# Patient Record
Sex: Female | Born: 1960 | Race: White | Hispanic: No | Marital: Married | Smoking: Current every day smoker
Health system: Southern US, Community
[De-identification: ages and names within clinical notes are randomized; demographics above are authoritative.]

## PROBLEM LIST (undated history)

## (undated) DIAGNOSIS — F329 Major depressive disorder, single episode, unspecified: Secondary | ICD-10-CM

## (undated) DIAGNOSIS — F32A Depression, unspecified: Secondary | ICD-10-CM

## (undated) DIAGNOSIS — E079 Disorder of thyroid, unspecified: Secondary | ICD-10-CM

## (undated) DIAGNOSIS — E119 Type 2 diabetes mellitus without complications: Secondary | ICD-10-CM

## (undated) DIAGNOSIS — E039 Hypothyroidism, unspecified: Secondary | ICD-10-CM

## (undated) HISTORY — DX: Major depressive disorder, single episode, unspecified: F32.9

## (undated) HISTORY — DX: Depression, unspecified: F32.A

## (undated) HISTORY — DX: Disorder of thyroid, unspecified: E07.9

## (undated) HISTORY — PX: CHOLECYSTECTOMY: SHX55

---

## 2006-11-07 ENCOUNTER — Ambulatory Visit (HOSPITAL_COMMUNITY): Admission: RE | Admit: 2006-11-07 | Discharge: 2006-11-07 | Payer: Self-pay | Admitting: Student

## 2010-09-17 ENCOUNTER — Ambulatory Visit (HOSPITAL_COMMUNITY): Admission: RE | Admit: 2010-09-17 | Discharge: 2010-09-17 | Payer: Self-pay | Admitting: Family Medicine

## 2010-09-23 ENCOUNTER — Ambulatory Visit (HOSPITAL_COMMUNITY): Admission: RE | Admit: 2010-09-23 | Discharge: 2010-09-23 | Payer: Self-pay | Admitting: Family Medicine

## 2011-03-29 ENCOUNTER — Other Ambulatory Visit: Payer: Self-pay | Admitting: Obstetrics and Gynecology

## 2011-03-29 DIAGNOSIS — N852 Hypertrophy of uterus: Secondary | ICD-10-CM

## 2011-04-01 ENCOUNTER — Ambulatory Visit (HOSPITAL_COMMUNITY)
Admission: RE | Admit: 2011-04-01 | Discharge: 2011-04-01 | Disposition: A | Discharge status: Home / Self Care | Payer: Self-pay | Source: Ambulatory Visit | Attending: Obstetrics and Gynecology | Admitting: Obstetrics and Gynecology

## 2011-04-01 ENCOUNTER — Ambulatory Visit (HOSPITAL_COMMUNITY): Payer: Self-pay

## 2011-04-01 DIAGNOSIS — R9389 Abnormal findings on diagnostic imaging of other specified body structures: Secondary | ICD-10-CM | POA: Insufficient documentation

## 2011-04-01 DIAGNOSIS — N852 Hypertrophy of uterus: Secondary | ICD-10-CM | POA: Insufficient documentation

## 2011-04-01 DIAGNOSIS — N898 Other specified noninflammatory disorders of vagina: Secondary | ICD-10-CM | POA: Insufficient documentation

## 2011-04-08 ENCOUNTER — Observation Stay (HOSPITAL_COMMUNITY)
Admission: EM | Admit: 2011-04-08 | Discharge: 2011-04-09 | Disposition: A | Discharge status: Home / Self Care | Payer: Self-pay | Source: Ambulatory Visit | Attending: Obstetrics & Gynecology | Admitting: Obstetrics & Gynecology

## 2011-04-08 ENCOUNTER — Emergency Department (HOSPITAL_COMMUNITY): Payer: Self-pay

## 2011-04-08 DIAGNOSIS — N920 Excessive and frequent menstruation with regular cycle: Secondary | ICD-10-CM | POA: Insufficient documentation

## 2011-04-08 DIAGNOSIS — D500 Iron deficiency anemia secondary to blood loss (chronic): Principal | ICD-10-CM | POA: Insufficient documentation

## 2011-04-08 LAB — BASIC METABOLIC PANEL
Calcium: 8.6 mg/dL (ref 8.4–10.5)
GFR calc Af Amer: >60 mL/min (ref >60)
GFR calc non Af Amer: >60 mL/min (ref >60)
Glucose, Bld: 113 mg/dL — ABNORMAL HIGH (ref 70–99)
Sodium: 136 mEq/L (ref 135–145)

## 2011-04-08 LAB — DIFFERENTIAL
Basophils Absolute: 0 10*3/uL (ref 0.0–0.1)
Basophils Relative: 0 % (ref 0–1)
Eosinophils Absolute: 0.2 10*3/uL (ref 0.0–0.7)
Eosinophils Relative: 2 % (ref 0–5)
Lymphocytes Relative: 19 % (ref 12–46)
Lymphs Abs: 1.8 10*3/uL (ref 0.7–4.0)
Monocytes Absolute: 0.8 10*3/uL (ref 0.1–1.0)
Monocytes Relative: 8 % (ref 3–12)
Neutro Abs: 6.9 10*3/uL (ref 1.7–7.7)
Neutrophils Relative %: 71 % (ref 43–77)

## 2011-04-08 LAB — CBC
HCT: 20.1 % — ABNORMAL LOW (ref 36.0–46.0)
MCHC: 31.8 g/dL (ref 30.0–36.0)
RDW: 15.9 % — ABNORMAL HIGH (ref 11.5–15.5)

## 2011-04-08 LAB — POCT PREGNANCY, URINE: Preg Test, Ur: NEGATIVE

## 2011-04-08 LAB — ABO/RH: ABO/RH(D): A POS

## 2011-04-09 LAB — CBC
HCT: 25.7 % — ABNORMAL LOW (ref 36.0–46.0)
Hemoglobin: 8.4 g/dL — ABNORMAL LOW (ref 12.0–15.0)
MCV: 93.5 fL (ref 78.0–100.0)
RBC: 2.75 MIL/uL — ABNORMAL LOW (ref 3.87–5.11)
WBC: 10.3 10*3/uL (ref 4.0–10.5)

## 2011-04-09 LAB — DIFFERENTIAL
Eosinophils Relative: 2 % (ref 0–5)
Lymphocytes Relative: 23 % (ref 12–46)
Lymphs Abs: 2.4 10*3/uL (ref 0.7–4.0)
Monocytes Relative: 6 % (ref 3–12)
Neutrophils Relative %: 69 % (ref 43–77)

## 2011-04-09 LAB — CROSSMATCH
Antibody Screen: NEGATIVE
Unit division: 0

## 2011-04-30 ENCOUNTER — Other Ambulatory Visit: Payer: Self-pay | Admitting: Obstetrics & Gynecology

## 2011-04-30 ENCOUNTER — Encounter (HOSPITAL_COMMUNITY): Payer: Self-pay | Attending: Obstetrics & Gynecology

## 2011-04-30 DIAGNOSIS — N840 Polyp of corpus uteri: Secondary | ICD-10-CM | POA: Insufficient documentation

## 2011-04-30 DIAGNOSIS — Z01812 Encounter for preprocedural laboratory examination: Secondary | ICD-10-CM | POA: Insufficient documentation

## 2011-04-30 DIAGNOSIS — N920 Excessive and frequent menstruation with regular cycle: Secondary | ICD-10-CM | POA: Insufficient documentation

## 2011-04-30 LAB — URINALYSIS, ROUTINE W REFLEX MICROSCOPIC
Glucose, UA: NEGATIVE mg/dL
Hgb urine dipstick: NEGATIVE
Ketones, ur: NEGATIVE mg/dL
pH: 6 (ref 5.0–8.0)

## 2011-04-30 LAB — COMPREHENSIVE METABOLIC PANEL
ALT: 17 U/L (ref 0–35)
AST: 15 U/L (ref 0–37)
Albumin: 4.2 g/dL (ref 3.5–5.2)
Alkaline Phosphatase: 67 U/L (ref 39–117)
BUN: 12 mg/dL (ref 6–23)
Chloride: 104 mEq/L (ref 96–112)
GFR calc Af Amer: >60 mL/min (ref >60)
Potassium: 4.3 mEq/L (ref 3.5–5.1)
Sodium: 138 mEq/L (ref 135–145)
Total Protein: 7.5 g/dL (ref 6.0–8.3)

## 2011-04-30 LAB — SURGICAL PCR SCREEN: Staphylococcus aureus: POSITIVE — AB

## 2011-04-30 LAB — CBC
HCT: 37.5 % (ref 36.0–46.0)
Hemoglobin: 12.1 g/dL (ref 12.0–15.0)
Platelets: 227 10*3/uL (ref 150–400)
RDW: 13.7 % (ref 11.5–15.5)

## 2011-05-05 ENCOUNTER — Other Ambulatory Visit: Payer: Self-pay | Admitting: Obstetrics & Gynecology

## 2011-05-05 ENCOUNTER — Ambulatory Visit (HOSPITAL_COMMUNITY)
Admission: RE | Admit: 2011-05-05 | Discharge: 2011-05-05 | Disposition: A | Discharge status: Home / Self Care | Payer: Self-pay | Source: Ambulatory Visit | Attending: Obstetrics & Gynecology | Admitting: Obstetrics & Gynecology

## 2011-05-05 DIAGNOSIS — N841 Polyp of cervix uteri: Secondary | ICD-10-CM | POA: Insufficient documentation

## 2011-05-05 DIAGNOSIS — N920 Excessive and frequent menstruation with regular cycle: Secondary | ICD-10-CM | POA: Insufficient documentation

## 2011-05-05 DIAGNOSIS — D509 Iron deficiency anemia, unspecified: Secondary | ICD-10-CM | POA: Insufficient documentation

## 2011-05-07 NOTE — Op Note (Signed)
NAME:  Andrea Benitez, Andrea Benitez              ACCOUNT NO.:  [id number]  MEDICAL RECORD NO.:  [id number]           PATIENT TYPE:  O  LOCATION:  DAYP                          FACILITY:  [facility]  PHYSICIAN:  Lazaro Arms, M.D.   DATE OF BIRTH:  [birth year]  DATE OF PROCEDURE:  05/05/2011 DATE OF DISCHARGE:                              OPERATIVE REPORT   PREOPERATIVE DIAGNOSES: 1. Menometrorrhagia. 2. Severe anemia requiring overnight hospitalization last month with     transfusion of 2 units packed red blood cells. 3. Positive response to megestrol therapy and iron. 4. Normal pelvic ultrasound of endometrium.  POSTOPERATIVE DIAGNOSES: 1. Menometrorrhagia. 2. Severe anemia requiring overnight hospitalization last month with     transfusion of 2 units packed red blood cells. 3. Positive response to megestrol therapy and iron. 4. Normal pelvic ultrasound of endometrium. 5. Endocervical polyp.  PROCEDURE: 1. Diagnostic hysteroscopy with uterine curettage. 2. Failed attempt at endometrial ablation. 3. Removal of endocervical polyp. 4. Cervical biopsies, random.  SURGEON:  Lazaro Arms, MD  ANESTHESIA:  General endotracheal.  FINDINGS:  The patient knew she had a slightly enlarged uterus, but from her ultrasound she had back on March 18, 2011, however, it was certainly appropriate for endometrial evaluation and ablation.  In any event, at the time of surgery today it was a large cavity with probably adenomyosis which caused the uterus to continue to expand.  I got to 120 mL and stopped at that point.  The endometrial polyp that was seen was not found that was seen on ultrasound.  Additionally, she had endocervical polyp which was removed and she has had sort of a firm cervix.  I did some random cervical biopsies to ensure no problem with that.  DESCRIPTION OF OPERATION:  The patient was taken to the operating room, placed in the supine position where she underwent general  endotracheal anesthesia.  She was placed in low lithotomy position, prepped and draped in usual sterile fashion.  Her cervix was dilated serially to allow passage of the hysteroscope.  An endocervical polyp was seen and removed.  An additional biopsy was taken to ensure complete removal. Diagnostic hysteroscopy was performed and was found to be normal.  There were no polyps, fibroids, or other abnormalities seen.  A vigorous uterine curettage was performed, and all tissue was removed and sent to pathology for evaluation.  I then attempted an endometrial ablation but was unsuccessful.  I got 120 mL and it was just too large and too spongy of a cavity to allow appropriate pressure maintenance in the balloon. As a result, the surgery was aborted.  I decided to go ahead and do three random biopsies as well of the cervix because she had just sort of firm, so I just took three random biopsies of the areas.  She had several nabothian cysts as well, all of which should be benign.  Monsel was used to obtain hemostasis.  The patient was then awakened from surgery, taken to recovery room in good and stable condition.  All counts correct.  She experienced minimal blood loss, and she received Ancef and Toradol preoperatively  prophylactically.     Lazaro Arms, M.D.     Loraine Maple  D:  05/05/2011  T:  05/05/2011  Job:  [id number]  Electronically Signed by Duane Lope M.D. on 05/07/2011 09:42:38 AM

## 2011-06-16 NOTE — Discharge Summary (Signed)
  [name], Andrea Benitez              ACCOUNT NO.:  [id number]  MEDICAL RECORD NO.:  [id number]  LOCATION:  A215                          FACILITY:  [facility]  PHYSICIAN:  Lazaro Arms, M.D.   DATE OF BIRTH:  August 21, 1961  DATE OF ADMISSION:  04/08/2011 DATE OF DISCHARGE:  04/27/2012LH                              DISCHARGE SUMMARY   DISCHARGE DIAGNOSES: 1. Status post 2 units of packed red blood cells. 2. Symptomatic anemia. 3. Menometrorrhagia.  Please refer to [facility] record for details of admission to the hospital.  HOSPITAL COURSE:  The patient was admitted with symptomatic anemia.  Her hemoglobin was 6.4, hematocrit was 20.1.  She was bleeding heavily. Ultrasound was normal except for a thickened endometrium.  I gave her high-dose Megace.  I gave her 2 units of packed red blood cells. Hemoglobin count 8.4 and 25.7.  She is scheduled to be seen in my office next week and to the plan endometrial ablation in the near future.  She was discharged to home on Megace iron.     Lazaro Arms, M.D.     Andrea Benitez  D:  06/15/2011  T:  06/16/2011  Job:  [id number]  Electronically Signed by Duane Lope M.D. on 06/16/2011 04:45:08 PM

## 2011-10-28 ENCOUNTER — Other Ambulatory Visit (HOSPITAL_COMMUNITY): Payer: Self-pay | Admitting: Family Medicine

## 2011-10-28 DIAGNOSIS — Z139 Encounter for screening, unspecified: Secondary | ICD-10-CM

## 2011-11-09 ENCOUNTER — Ambulatory Visit (HOSPITAL_COMMUNITY)
Admission: RE | Admit: 2011-11-09 | Discharge: 2011-11-09 | Disposition: A | Discharge status: Home / Self Care | Payer: PRIVATE HEALTH INSURANCE | Source: Ambulatory Visit | Attending: Family Medicine | Admitting: Family Medicine

## 2011-11-09 DIAGNOSIS — Z139 Encounter for screening, unspecified: Secondary | ICD-10-CM

## 2011-11-09 DIAGNOSIS — Z1231 Encounter for screening mammogram for malignant neoplasm of breast: Secondary | ICD-10-CM | POA: Insufficient documentation

## 2011-12-29 IMAGING — MG MM DIGITAL SCREENING
1 series · 1 of 1 positions shown · non-contrast
Comparison: Prior studies.

DG SCREEN MAMMOGRAM BILATERAL
Bilateral CC and MLO view(s) were taken.

DIGITAL SCREENING MAMMOGRAM WITH CAD:

[R MLO]
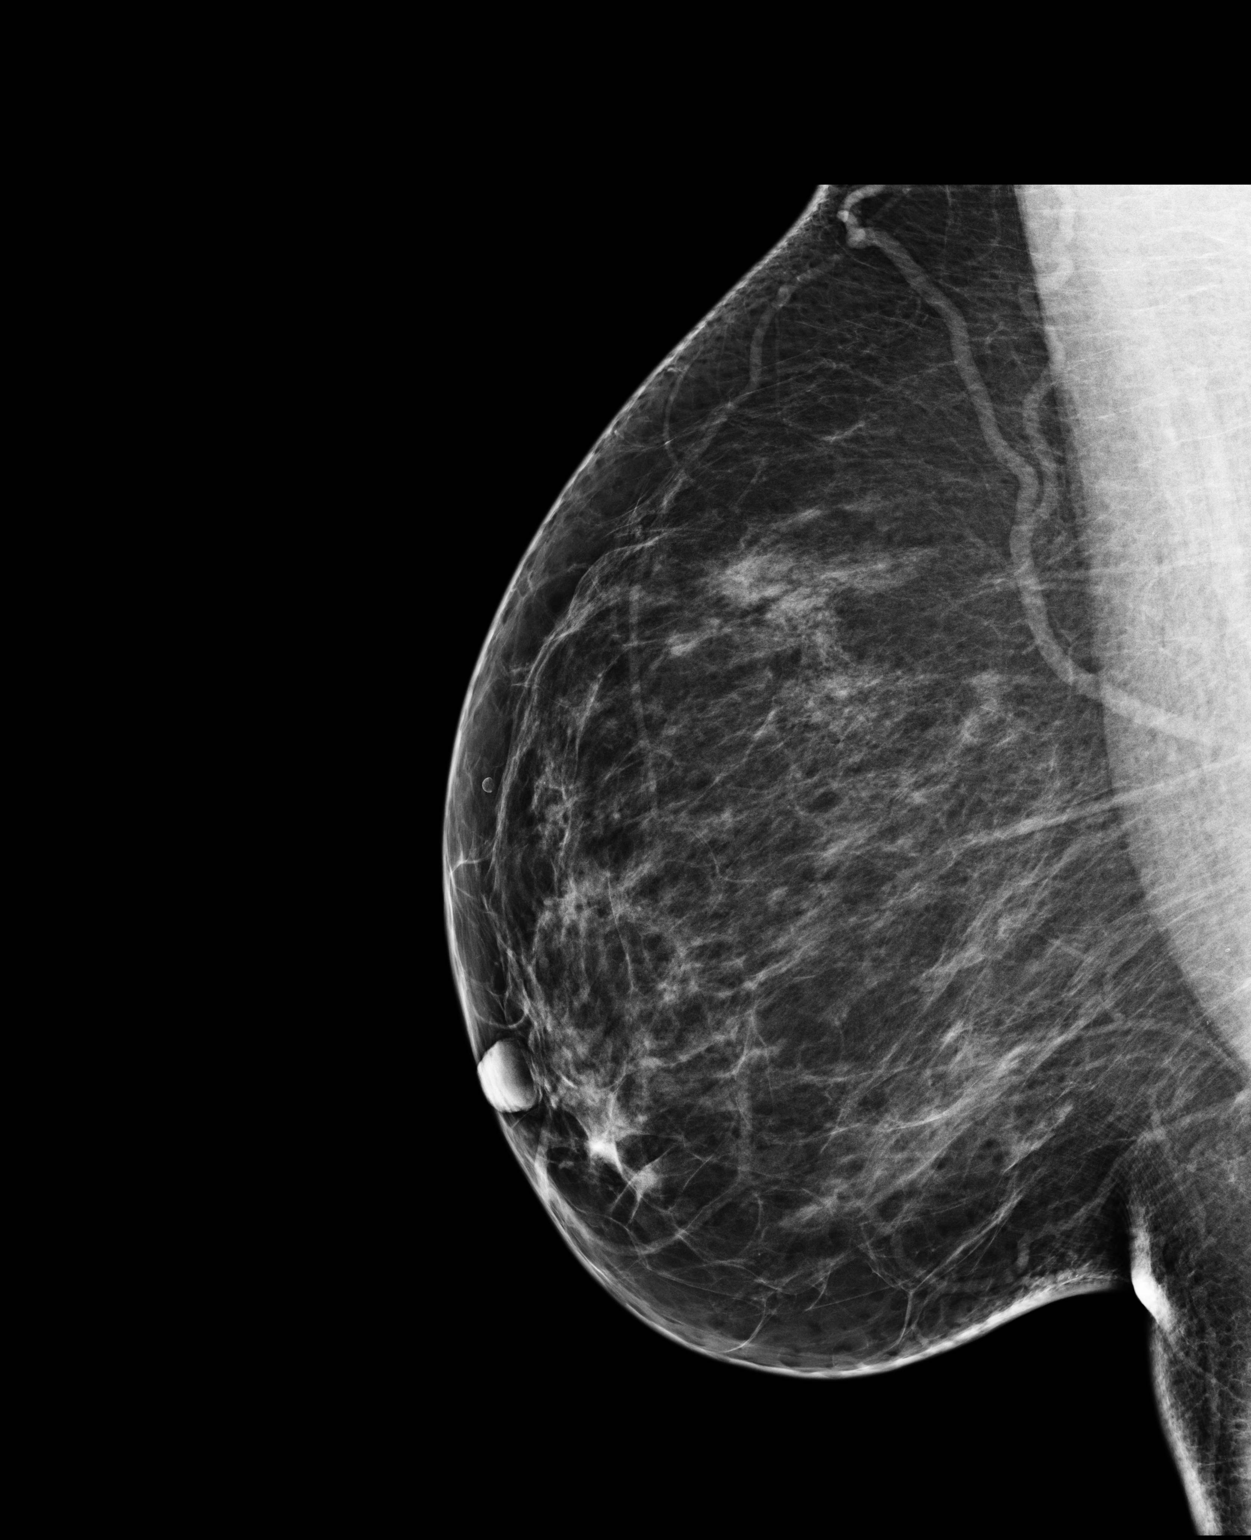

[1 of 1 positions shown; findings below may reference images not displayed]

There are scattered fibroglandular densities.  A possible mass is noted in the right breast.  Spot 
compression views and possibly sonography are recommended for further evaluation.  The left breast 
is unremarkable.

Images were processed with CAD.
IMPRESSION: Possible mass, right breast.  Additional evaluation is indicated.  The patient will be contacted 
for additional studies and a supplemental report will follow.

ASSESSMENT: Need additional imaging evaluation and/or prior mammograms for comparison - BI-RADS 0 -
Right

Further imaging of the right breast.
,

## 2012-04-03 ENCOUNTER — Other Ambulatory Visit: Payer: Self-pay | Admitting: Obstetrics & Gynecology

## 2012-07-12 IMAGING — US US TRANSVAGINAL NON-OB
1 series · 13 of 25 positions shown · non-contrast
Comparison: None.

CLINICAL DATA: Enlarged uterus with heavy vaginal bleeding.  LMP
03/18/2011



[Series 1: us transvaginal non-ob · 0.31mm/px · 13 of 100 slices shown]
[im 1/100]
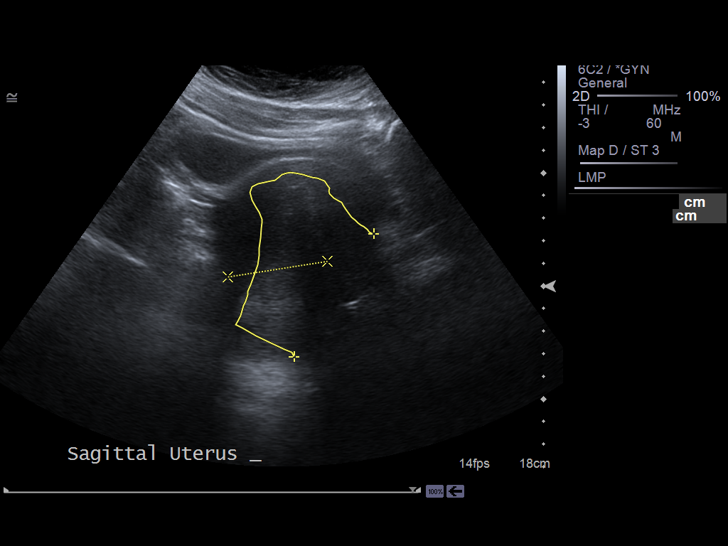
[im 9/100]
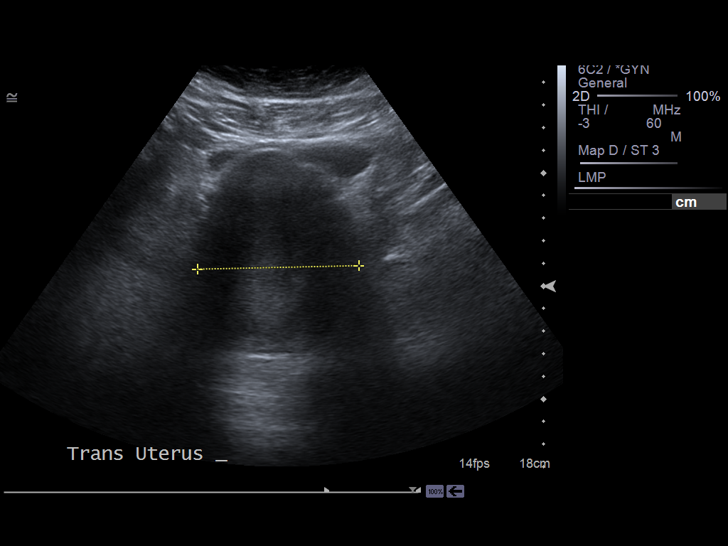
[im 17/100]
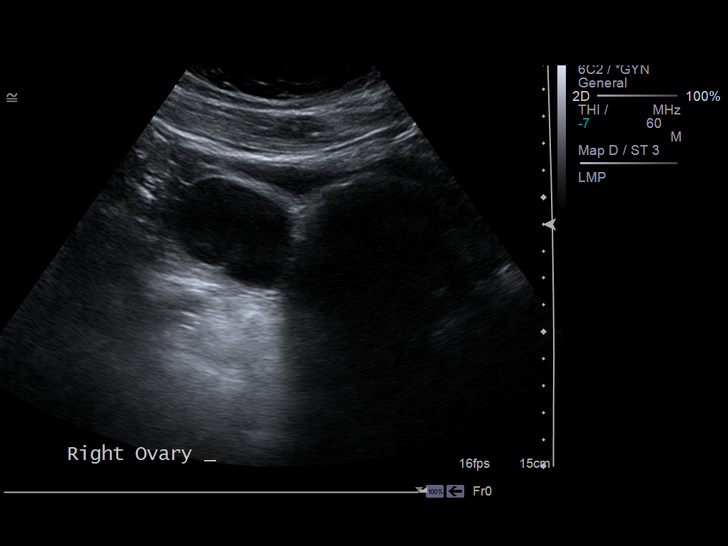
[im 25/100]
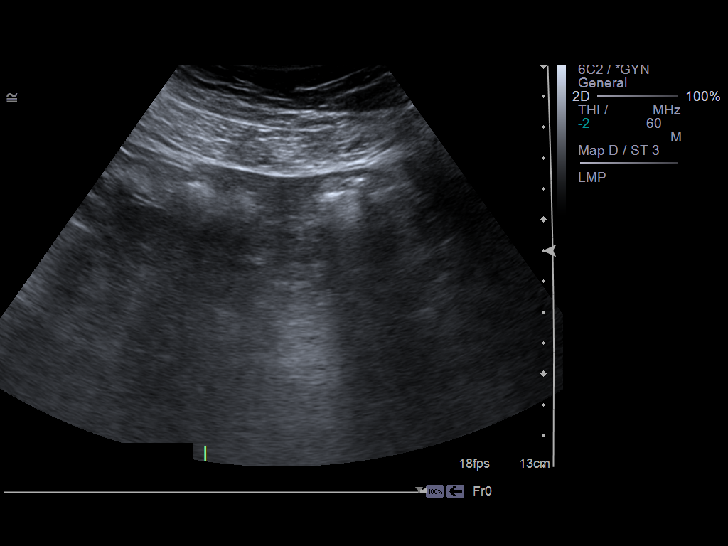
[im 34/100]
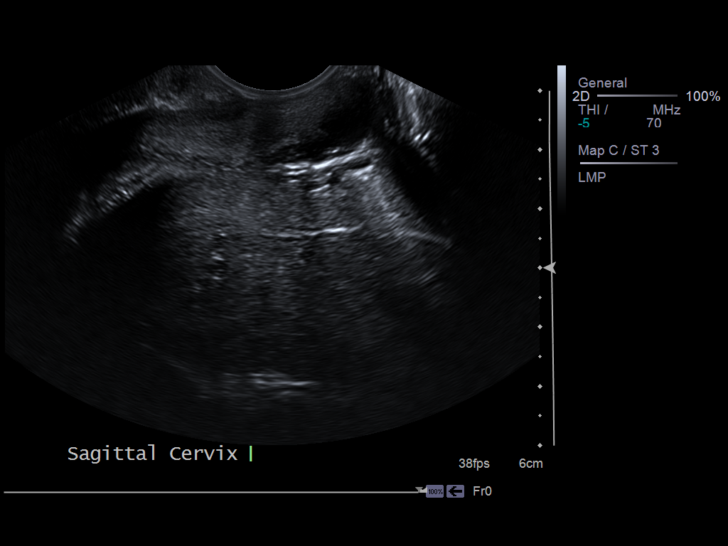
[im 42/100]
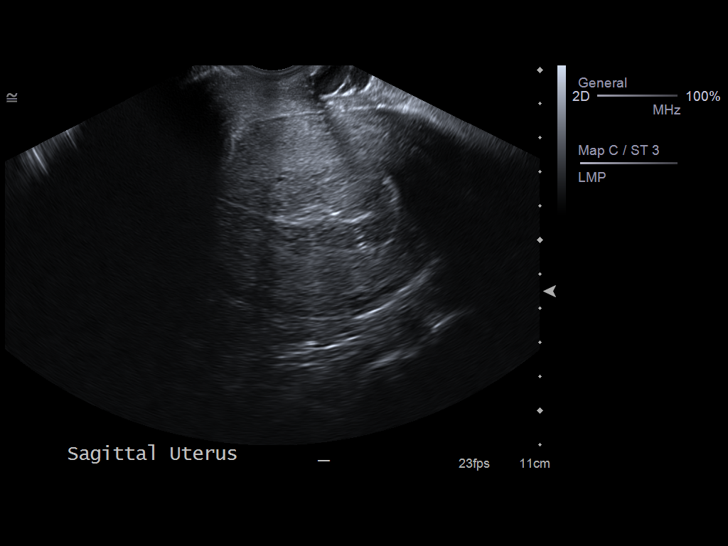
[im 50/100]
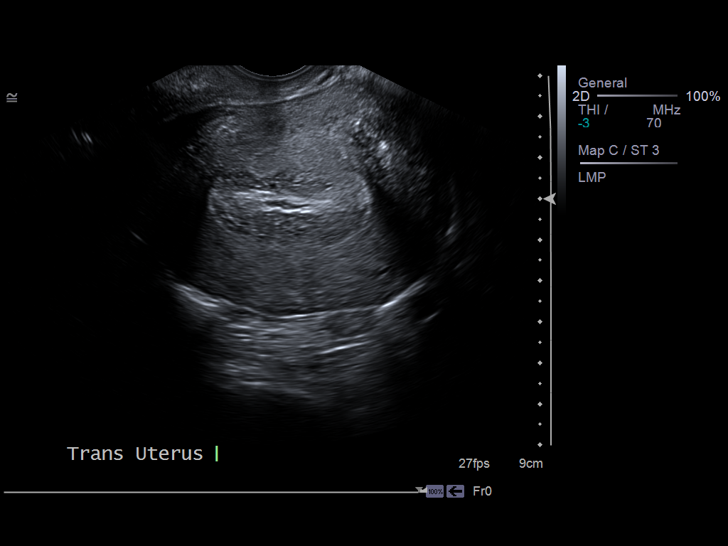
[im 58/100]
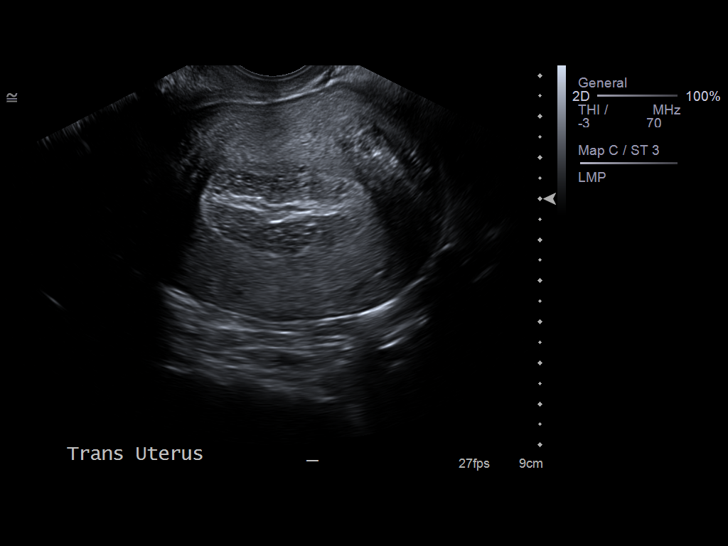
[im 67/100]
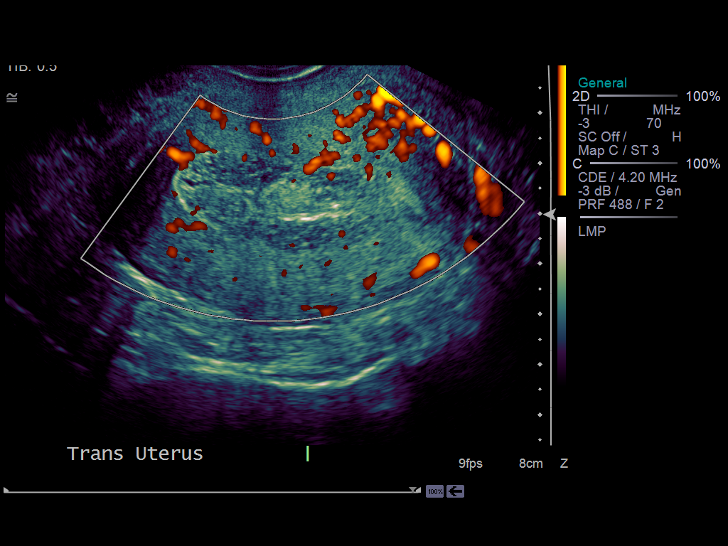
[im 75/100]
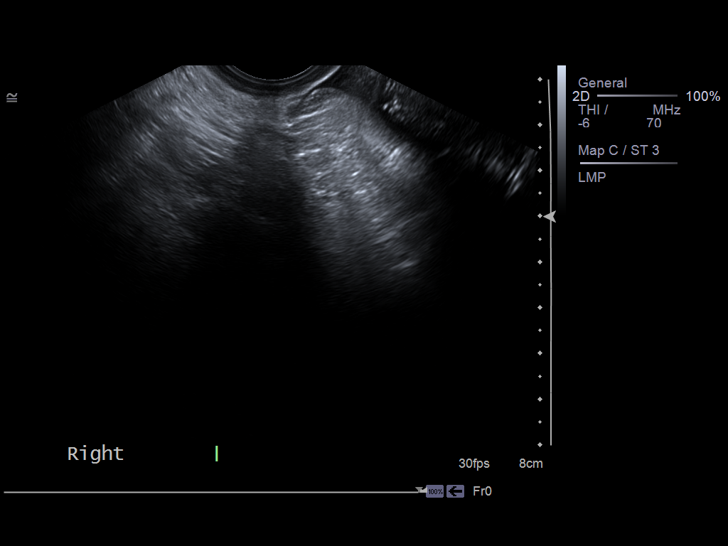
[im 83/100]
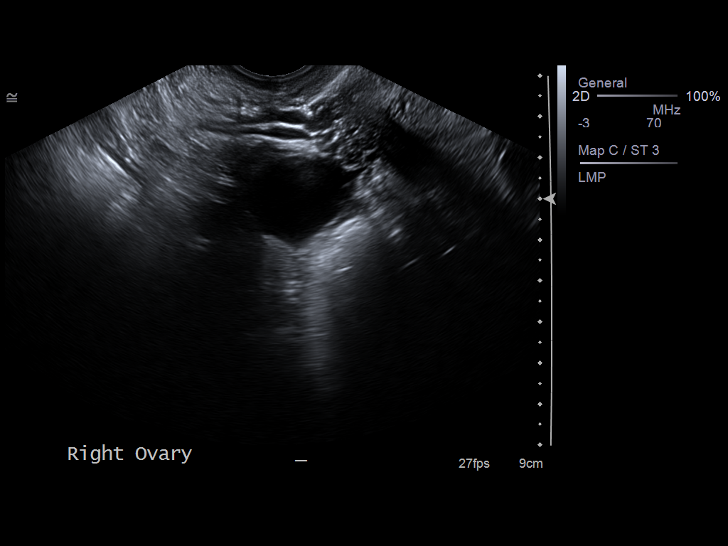
[im 91/100]
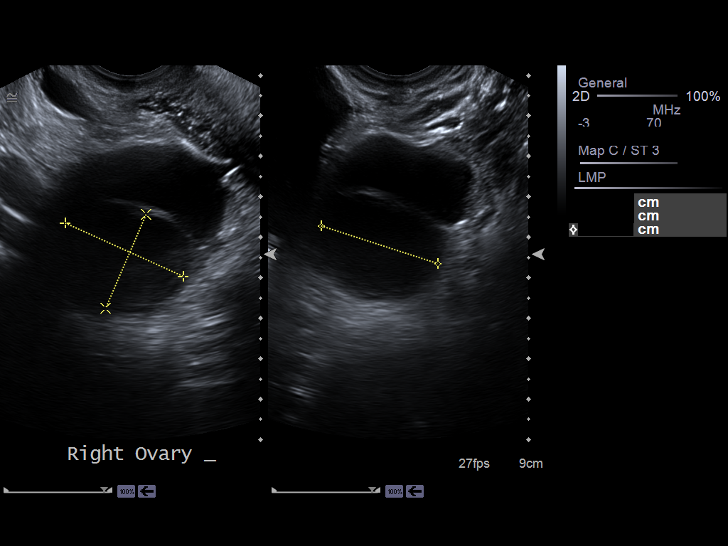
[im 100/100]
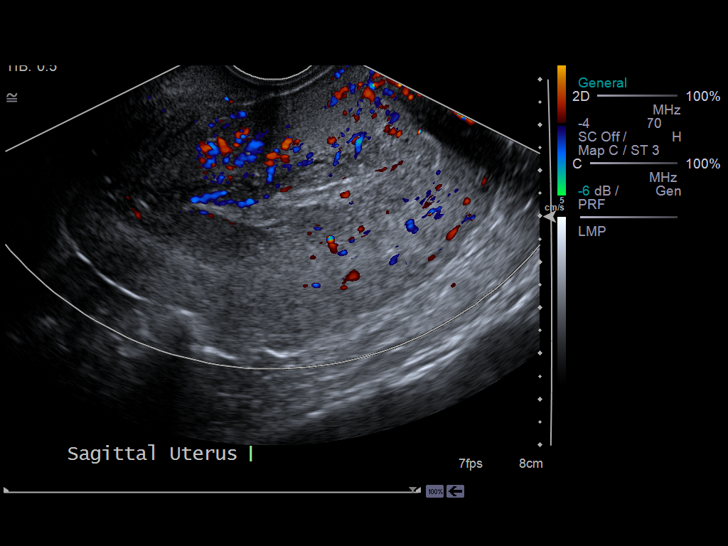

[13 of 25 positions shown; findings below may reference images not displayed]

FINDINGS: Uterus:  Is retroflexed and demonstrates a sagittal length of
cm, an AP depth of 6.3 cm and a transverse width of 7.7 cm.

Endometrium:  The endometrial complex has an unusual morphology
with an echogenic central area and surrounding hypoechoic area
containing multiple small cystic foci.  This has an overall width
of 2.6 cm.  I suspect that this represents pseudo endometrial
widening and may be the result of diffuse adenomyosis with a cystic
hypoechoic transitional zone. An area of focal increased
echogenicity is seen in the posterior fundal region measuring
by 1.6 by 0.9 cm.  This demonstrates a feeding vessel and is most
compatible with a focal polyp.

Right ovary:  A normal appearing right ovary is not visualized with
confidence.  In the right adnexa there is a thin walled cystic area
measuring 5.2 x 3.4 x 3.6 cm which contains a solitary septation.
On the transabdominal scan this septation appears incomplete and
raises the possibility of this representing a hydrosalpinx.  As no
separate right ovary can be seen with confidence, the possibility
of a cystic ovarian lesion is not excluded.

Left ovary:  Measures 2.4 x 1.5 by 2.2 cm and contains a dominant
follicle.

Other findings:  No pelvic fluid is noted
IMPRESSION: Prominent uterine size with suspected pseudowidening of the
endometrial complex with an appearance raising the possibility of
diffuse adenomyosis.

Findings compatible with a focal endometrial polyp.

Right adnexal cystic lesion with suggested incomplete septa
suspicious for a hydrosalpinx.  A separate right ovary is not seen
and therefore ovarian lesion is not excluded with confidence.
Further evaluation of this finding and the abnormal endometrial
complex can be performed with pelvic MRI with contrast which can
provide improved evaluation of the transitional zone thickness and
may allow identification of a separate right ovary and confirm the
suspicion of tubal dilatation.

## 2012-12-21 ENCOUNTER — Other Ambulatory Visit (HOSPITAL_COMMUNITY): Payer: Self-pay | Admitting: *Deleted

## 2012-12-21 DIAGNOSIS — Z139 Encounter for screening, unspecified: Secondary | ICD-10-CM

## 2012-12-28 ENCOUNTER — Ambulatory Visit (HOSPITAL_COMMUNITY)
Admission: RE | Admit: 2012-12-28 | Discharge: 2012-12-28 | Disposition: A | Discharge status: Home / Self Care | Payer: Self-pay | Source: Ambulatory Visit | Attending: *Deleted | Admitting: *Deleted

## 2012-12-28 DIAGNOSIS — Z139 Encounter for screening, unspecified: Secondary | ICD-10-CM

## 2013-07-16 ENCOUNTER — Telehealth: Payer: Self-pay | Admitting: Obstetrics & Gynecology

## 2013-07-16 MED ORDER — MEGESTROL ACETATE 40 MG PO TABS: 40.0000 mg | ORAL_TABLET | Freq: Every day | ORAL | Status: DC

## 2013-07-16 NOTE — Telephone Encounter (Signed)
Requesting refill on Megace for vaginal bleeding. Pt states does have an appt 07/27/2013 with Dr. Despina Hidden.

## 2013-07-27 ENCOUNTER — Ambulatory Visit: Payer: Self-pay | Admitting: Obstetrics & Gynecology

## 2013-08-20 ENCOUNTER — Ambulatory Visit: Payer: Self-pay | Admitting: Obstetrics & Gynecology

## 2013-10-30 ENCOUNTER — Encounter (INDEPENDENT_AMBULATORY_CARE_PROVIDER_SITE_OTHER): Payer: Self-pay

## 2013-10-30 ENCOUNTER — Ambulatory Visit (INDEPENDENT_AMBULATORY_CARE_PROVIDER_SITE_OTHER): Payer: Self-pay | Admitting: Obstetrics & Gynecology

## 2013-10-30 ENCOUNTER — Encounter: Payer: Self-pay | Admitting: Obstetrics & Gynecology

## 2013-10-30 VITALS — BP 130/90 | Ht 64.0 in | Wt 212.0 lb

## 2013-10-30 DIAGNOSIS — F3289 Other specified depressive episodes: Secondary | ICD-10-CM

## 2013-10-30 DIAGNOSIS — F329 Major depressive disorder, single episode, unspecified: Secondary | ICD-10-CM

## 2013-10-30 MED ORDER — ESCITALOPRAM OXALATE 10 MG PO TABS: 10.0000 mg | ORAL_TABLET | Freq: Every day | ORAL | Status: DC

## 2013-10-30 NOTE — Progress Notes (Signed)
Patient ID: Andrea Benitez, female   DOB: 05/20/1961, 52 y.o.   MRN: [id number] Pt presents complaining of problems once again with depression stemming from a 30 year history of marital issues related, in her mind, to infidelity Not confirmed she says Had marital counselling in past  None currently Anhedonia, diminished energy  Will try Lexapro 10 mg daily Follow up 1 month for evaluation

## 2013-11-27 ENCOUNTER — Encounter: Payer: Self-pay | Admitting: Obstetrics & Gynecology

## 2013-11-27 ENCOUNTER — Ambulatory Visit (INDEPENDENT_AMBULATORY_CARE_PROVIDER_SITE_OTHER): Payer: Self-pay | Admitting: Obstetrics & Gynecology

## 2013-11-27 VITALS — BP 138/90 | Wt 210.0 lb

## 2013-11-27 DIAGNOSIS — F329 Major depressive disorder, single episode, unspecified: Secondary | ICD-10-CM

## 2013-11-27 DIAGNOSIS — F3289 Other specified depressive episodes: Secondary | ICD-10-CM

## 2013-11-27 NOTE — Progress Notes (Signed)
Patient ID: Andrea Benitez, female   DOB: [birth year], 52 y.o.   MRN: [id number] Pt started on Lexapro 10mg  1 month ago Pt states she is "completely" better  Will maintain on that dose No side effects or complications  Follow up in 3 months  Past Medical History  Diagnosis Date  . Thyroid disease   . Depression     Past Surgical History  Procedure Laterality Date  . Cholecystectomy      OB History   Grav Para Term Preterm Abortions TAB SAB Ect Mult Living                  Not on File  History   Social History  . Marital Status: Married    Spouse Name: N/A    Number of Children: N/A  . Years of Education: N/A   Social History Main Topics  . Smoking status: Current Every Day Smoker  . Smokeless tobacco: None  . Alcohol Use: None  . Drug Use: None  . Sexual Activity: None   Other Topics Concern  . None   Social History Narrative  . None    History reviewed. No pertinent family history.  Patient Active Problem List   Diagnosis Date Noted  . Depressive disorder, not elsewhere classified 10/30/2013

## 2014-01-02 ENCOUNTER — Telehealth: Payer: Self-pay | Admitting: *Deleted

## 2014-01-02 MED ORDER — ESCITALOPRAM OXALATE 10 MG PO TABS: 10.0000 mg | ORAL_TABLET | Freq: Every day | ORAL | Status: DC

## 2014-01-02 NOTE — Telephone Encounter (Signed)
Spoke with pt. She is requesting a refill on Lexapro 10 mg. She saw you in December. Will you refill this? Uses [facility] in [place]. Thanks!!!

## 2014-01-02 NOTE — Telephone Encounter (Signed)
Left message letting pt know Lexapro was refilled. Sent to pharmacy. [place]

## 2014-02-25 ENCOUNTER — Ambulatory Visit: Payer: Self-pay | Admitting: Obstetrics & Gynecology

## 2014-02-26 ENCOUNTER — Ambulatory Visit (INDEPENDENT_AMBULATORY_CARE_PROVIDER_SITE_OTHER): Payer: Self-pay | Admitting: Obstetrics & Gynecology

## 2014-02-26 ENCOUNTER — Encounter (INDEPENDENT_AMBULATORY_CARE_PROVIDER_SITE_OTHER): Payer: Self-pay

## 2014-02-26 ENCOUNTER — Encounter: Payer: Self-pay | Admitting: Obstetrics & Gynecology

## 2014-02-26 VITALS — BP 120/80 | Wt 210.0 lb

## 2014-02-26 DIAGNOSIS — F329 Major depressive disorder, single episode, unspecified: Secondary | ICD-10-CM

## 2014-02-26 DIAGNOSIS — F3289 Other specified depressive episodes: Secondary | ICD-10-CM

## 2014-02-26 NOTE — Progress Notes (Signed)
Patient ID: Andrea Benitez, female   DOB: 10/17/1961, 53 y.o.   MRN: [id number] Follow up 4 months on Lexapro 10 mg Pt states she is "completely" better  Will continue for at least 3 more months Follow up at that time  Past Medical History  Diagnosis Date  . Thyroid disease   . Depression     Past Surgical History  Procedure Laterality Date  . Cholecystectomy      OB History   Grav Para Term Preterm Abortions TAB SAB Ect Mult Living                  Not on File  History   Social History  . Marital Status: Married    Spouse Name: N/A    Number of Children: N/A  . Years of Education: N/A   Social History Benitez Topics  . Smoking status: Current Every Day Smoker  . Smokeless tobacco: None  . Alcohol Use: None  . Drug Use: None  . Sexual Activity: None   Other Topics Concern  . None   Social History Narrative  . None    History reviewed. No pertinent family history.

## 2014-04-02 ENCOUNTER — Other Ambulatory Visit (HOSPITAL_COMMUNITY): Payer: Self-pay | Admitting: *Deleted

## 2014-04-02 DIAGNOSIS — Z1231 Encounter for screening mammogram for malignant neoplasm of breast: Secondary | ICD-10-CM

## 2014-04-05 ENCOUNTER — Ambulatory Visit (HOSPITAL_COMMUNITY): Payer: Self-pay

## 2014-04-08 ENCOUNTER — Ambulatory Visit (HOSPITAL_COMMUNITY)
Admission: RE | Admit: 2014-04-08 | Discharge: 2014-04-08 | Disposition: A | Discharge status: Home / Self Care | Payer: PRIVATE HEALTH INSURANCE | Source: Ambulatory Visit | Attending: *Deleted | Admitting: *Deleted

## 2014-04-08 DIAGNOSIS — Z1231 Encounter for screening mammogram for malignant neoplasm of breast: Secondary | ICD-10-CM

## 2014-04-10 ENCOUNTER — Other Ambulatory Visit (HOSPITAL_COMMUNITY): Payer: Self-pay | Admitting: *Deleted

## 2014-04-10 DIAGNOSIS — R928 Other abnormal and inconclusive findings on diagnostic imaging of breast: Secondary | ICD-10-CM

## 2014-05-01 ENCOUNTER — Ambulatory Visit (HOSPITAL_COMMUNITY)
Admission: RE | Admit: 2014-05-01 | Discharge: 2014-05-01 | Disposition: A | Discharge status: Home / Self Care | Payer: PRIVATE HEALTH INSURANCE | Source: Ambulatory Visit | Attending: *Deleted | Admitting: *Deleted

## 2014-05-01 ENCOUNTER — Other Ambulatory Visit (HOSPITAL_COMMUNITY): Payer: Self-pay | Admitting: *Deleted

## 2014-05-01 DIAGNOSIS — R928 Other abnormal and inconclusive findings on diagnostic imaging of breast: Secondary | ICD-10-CM

## 2014-05-01 DIAGNOSIS — N6089 Other benign mammary dysplasias of unspecified breast: Secondary | ICD-10-CM | POA: Insufficient documentation

## 2014-05-08 ENCOUNTER — Other Ambulatory Visit: Payer: Self-pay | Admitting: Obstetrics & Gynecology

## 2014-06-03 ENCOUNTER — Ambulatory Visit (INDEPENDENT_AMBULATORY_CARE_PROVIDER_SITE_OTHER): Payer: Self-pay | Admitting: Obstetrics & Gynecology

## 2014-06-03 ENCOUNTER — Encounter: Payer: Self-pay | Admitting: Obstetrics & Gynecology

## 2014-06-03 VITALS — BP 120/80 | Ht 64.0 in | Wt 211.0 lb

## 2014-06-03 DIAGNOSIS — F32A Depression, unspecified: Secondary | ICD-10-CM

## 2014-06-03 DIAGNOSIS — F3289 Other specified depressive episodes: Secondary | ICD-10-CM

## 2014-06-03 DIAGNOSIS — F329 Major depressive disorder, single episode, unspecified: Secondary | ICD-10-CM

## 2014-06-03 MED ORDER — ESCITALOPRAM OXALATE 10 MG PO TABS: ORAL_TABLET | ORAL | Status: DC

## 2014-06-03 NOTE — Progress Notes (Signed)
Patient ID: Andrea Benitez, female   DOB: 26-May-1961, 53 y.o.   MRN: [id number]  Blood pressure 120/80, height 5\' 4"  (1.626 m), weight 211 lb (95.709 kg).    Pt is here to evaluate her on going management with lexapro 10 mg We re evaluated previously and she wanted to stay on the 10 mg dosage She has continued to do well She is satisfied with her mood and anxiety levels She has more energy Her libido and sexual response is normal  ROS No burning with urination, frequency or urgency No nausea, vomiting or diarrhea Nor fever chills or other constitutional symptoms  No exam  Major depressive episode: Continue lexapro 10 mg We discussed stopping and pt wants to continue and I am supportive of that

## 2014-07-22 ENCOUNTER — Other Ambulatory Visit: Payer: Self-pay | Admitting: Obstetrics & Gynecology

## 2015-01-13 ENCOUNTER — Encounter: Payer: Self-pay | Admitting: Obstetrics & Gynecology

## 2015-01-13 ENCOUNTER — Ambulatory Visit (INDEPENDENT_AMBULATORY_CARE_PROVIDER_SITE_OTHER): Payer: Self-pay | Admitting: Obstetrics & Gynecology

## 2015-01-13 VITALS — BP 140/80 | Wt 222.0 lb

## 2015-01-13 DIAGNOSIS — F329 Major depressive disorder, single episode, unspecified: Secondary | ICD-10-CM

## 2015-01-13 DIAGNOSIS — N939 Abnormal uterine and vaginal bleeding, unspecified: Secondary | ICD-10-CM

## 2015-01-13 DIAGNOSIS — F32A Depression, unspecified: Secondary | ICD-10-CM

## 2015-01-13 MED ORDER — ESCITALOPRAM OXALATE 20 MG PO TABS: ORAL_TABLET | ORAL | Status: DC

## 2015-01-13 NOTE — Progress Notes (Signed)
Patient ID: Andrea Benitez, female   DOB: Feb 26, 1961, 54 y.o.   MRN: [id number] Pt has been on lexapro for maybe a year On 10 mg daily Feels a bit more sluggish and tired  Seasonal depression has been an issue in past   Will bump up to 20 mg daily  Continue megace  Yearly exams throught [facility]

## 2015-07-22 ENCOUNTER — Ambulatory Visit: Payer: Self-pay | Admitting: Obstetrics & Gynecology

## 2015-08-26 ENCOUNTER — Ambulatory Visit: Payer: Self-pay | Admitting: Obstetrics & Gynecology

## 2015-09-02 ENCOUNTER — Ambulatory Visit (INDEPENDENT_AMBULATORY_CARE_PROVIDER_SITE_OTHER): Payer: Self-pay | Admitting: Obstetrics & Gynecology

## 2015-09-02 ENCOUNTER — Encounter: Payer: Self-pay | Admitting: Obstetrics & Gynecology

## 2015-09-02 VITALS — BP 120/80 | HR 72 | Wt 224.0 lb

## 2015-09-02 DIAGNOSIS — F32A Depression, unspecified: Secondary | ICD-10-CM

## 2015-09-02 DIAGNOSIS — F329 Major depressive disorder, single episode, unspecified: Secondary | ICD-10-CM

## 2015-09-02 MED ORDER — ESCITALOPRAM OXALATE 20 MG PO TABS
ORAL_TABLET | ORAL | Status: DC
Start: 2015-09-02 — End: 2016-03-11

## 2015-09-02 NOTE — Progress Notes (Signed)
Patient ID: Andrea Benitez, female   DOB: Apr 14, 1961, 54 y.o.   MRN: [id number] Chief Complaint  Patient presents with  . Follow-up    Lexapro.    Blood pressure 120/80, pulse 72, weight 224 lb (101.606 kg).  54 y.o. No obstetric history on file. No LMP recorded. Patient is postmenopausal. The current method of family planning is post menopausal status.  Subjective No bleeding off megestrol Pt on lexapro doing well No suicidal thoughts or ideations No crying spells No insomnia Sex drive ok, can have orgams  Objective   Pertinent ROS   Labs or studies     Impression Diagnoses this Encounter::   ICD-9-CM ICD-10-CM   1. Depression 311 F32.9     Established relevant diagnosis(es):   Plan/Recommendations: Meds ordered this encounter  Medications  . escitalopram (LEXAPRO) 20 MG tablet    Sig: TAKE ONE TABLET BY MOUTH DAILY    Dispense:  30 tablet    Refill:  6    Labs or Scans Ordered: No orders of the defined types were placed in this encounter.      Follow up Return in about 6 months (around 03/01/2016) for Follow up, with Dr Elonda Husky.      Face to face time:  10 minutes  Greater than 50% of the visit time was spent in counseling and coordination of care with the patient.  The summary and outline of the counseling and care coordination is summarized in the note above.   All questions were answered.

## 2015-10-07 ENCOUNTER — Other Ambulatory Visit (HOSPITAL_COMMUNITY): Payer: Self-pay | Admitting: *Deleted

## 2015-10-07 DIAGNOSIS — Z1231 Encounter for screening mammogram for malignant neoplasm of breast: Secondary | ICD-10-CM

## 2015-10-16 ENCOUNTER — Ambulatory Visit (HOSPITAL_COMMUNITY): Payer: PRIVATE HEALTH INSURANCE

## 2015-10-23 ENCOUNTER — Ambulatory Visit (HOSPITAL_COMMUNITY)
Admission: RE | Admit: 2015-10-23 | Discharge: 2015-10-23 | Disposition: A | Discharge status: Home / Self Care | Payer: PRIVATE HEALTH INSURANCE | Source: Ambulatory Visit | Attending: *Deleted | Admitting: *Deleted

## 2015-10-23 DIAGNOSIS — Z1231 Encounter for screening mammogram for malignant neoplasm of breast: Secondary | ICD-10-CM

## 2016-03-01 ENCOUNTER — Ambulatory Visit: Payer: Self-pay | Admitting: Obstetrics & Gynecology

## 2016-03-04 ENCOUNTER — Ambulatory Visit: Payer: Self-pay | Admitting: Obstetrics & Gynecology

## 2016-03-05 ENCOUNTER — Ambulatory Visit: Payer: Self-pay | Admitting: Obstetrics & Gynecology

## 2016-03-11 ENCOUNTER — Ambulatory Visit (INDEPENDENT_AMBULATORY_CARE_PROVIDER_SITE_OTHER): Payer: Self-pay | Admitting: Obstetrics & Gynecology

## 2016-03-11 ENCOUNTER — Encounter: Payer: Self-pay | Admitting: Obstetrics & Gynecology

## 2016-03-11 VITALS — BP 128/80 | HR 80 | Wt 233.4 lb

## 2016-03-11 DIAGNOSIS — E038 Other specified hypothyroidism: Secondary | ICD-10-CM

## 2016-03-11 DIAGNOSIS — F32A Depression, unspecified: Secondary | ICD-10-CM

## 2016-03-11 DIAGNOSIS — F329 Major depressive disorder, single episode, unspecified: Secondary | ICD-10-CM

## 2016-03-11 MED ORDER — THYROID 97.5 MG PO TABS: 1.0000 | ORAL_TABLET | Freq: Every day | ORAL | Status: DC

## 2016-03-11 MED ORDER — ESCITALOPRAM OXALATE 20 MG PO TABS: ORAL_TABLET | ORAL | Status: DC

## 2016-03-11 NOTE — Progress Notes (Signed)
Patient ID: Andrea Benitez, female   DOB: 07/20/1961, 55 y.o.   MRN: [id number] Patient ID: Andrea Benitez, female   DOB: 05/07/1961, 55 y.o.   MRN: [id number] Chief Complaint  Patient presents with  . Medication Refill    Lexapro    Blood pressure 128/80, pulse 80, weight 233 lb 6.4 oz (105.87 kg).  55 y.o. No obstetric history on file. No LMP recorded. Patient is postmenopausal. The current method of family planning is post menopausal status.  Subjective No bleeding off megestrol Pt on lexapro doing well No suicidal thoughts or ideations No crying spells No insomnia Sex drive ok, can have orgams  Will switch to nature throid from synthroid, energy is down and skin is drier  Objective   Pertinent ROS   Labs or studies     Impression Diagnoses this Encounter::   ICD-9-CM ICD-10-CM   1. Depression 311 F32.9   2. 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 Thyroid (NATURE-THROID) 97.5 MG TABS    Sig: Take 1 tablet (97.5 mg total) by mouth daily.    Dispense:  30 tablet    Refill:  11  . escitalopram (LEXAPRO) 20 MG tablet    Sig: TAKE ONE TABLET BY MOUTH DAILY    Dispense:  30 tablet    Refill:  6    Labs or Scans Ordered: No orders of the defined types were placed in this encounter.      Follow up Return in about 6 months (around 09/11/2016) for Follow up, with Dr Elonda Husky.      Face to face time:  10 minutes  Greater than 50% of the visit time was spent in counseling and coordination of care with the patient.  The summary and outline of the counseling and care coordination is summarized in the note above.   All questions were answered.

## 2016-09-13 ENCOUNTER — Ambulatory Visit: Payer: Self-pay | Admitting: Obstetrics & Gynecology

## 2016-09-13 ENCOUNTER — Encounter: Payer: Self-pay | Admitting: *Deleted

## 2016-10-26 ENCOUNTER — Other Ambulatory Visit (HOSPITAL_COMMUNITY): Payer: Self-pay | Admitting: *Deleted

## 2016-10-26 DIAGNOSIS — Z1231 Encounter for screening mammogram for malignant neoplasm of breast: Secondary | ICD-10-CM

## 2016-11-25 ENCOUNTER — Ambulatory Visit (HOSPITAL_COMMUNITY)
Admission: RE | Admit: 2016-11-25 | Discharge: 2016-11-25 | Disposition: A | Discharge status: Home / Self Care | Payer: PRIVATE HEALTH INSURANCE | Source: Ambulatory Visit | Attending: *Deleted | Admitting: *Deleted

## 2016-11-25 DIAGNOSIS — Z1231 Encounter for screening mammogram for malignant neoplasm of breast: Secondary | ICD-10-CM

## 2017-02-15 ENCOUNTER — Other Ambulatory Visit: Payer: Self-pay | Admitting: Obstetrics & Gynecology

## 2017-03-29 ENCOUNTER — Ambulatory Visit (INDEPENDENT_AMBULATORY_CARE_PROVIDER_SITE_OTHER): Payer: Self-pay | Admitting: Obstetrics & Gynecology

## 2017-03-29 ENCOUNTER — Encounter: Payer: Self-pay | Admitting: Obstetrics & Gynecology

## 2017-03-29 VITALS — BP 140/86 | HR 80 | Ht 64.0 in | Wt 240.0 lb

## 2017-03-29 DIAGNOSIS — F325 Major depressive disorder, single episode, in full remission: Secondary | ICD-10-CM

## 2017-03-29 NOTE — Progress Notes (Signed)
Subjective:     Andrea Benitez is a 56 y.o. female who presents for follow up of depression. Current symptoms include none current. Symptoms have been controlled since that time. Patient denies anhedonia, depressed mood, difficulty concentrating, fatigue, feelings of worthlessness/guilt, hopelessness, hypersomnia, insomnia, psychomotor agitation, psychomotor retardation, recurrent thoughts of death, suicidal attempt, suicidal thoughts with specific plan, suicidal thoughts without plan and weight loss. Previous treatment includes: medication. She complains of the following side effects from the treatment: none.  The following portions of the patient's history were reviewed and updated as appropriate: allergies, current medications, past family history, past medical history, past social history, past surgical history and problem list.  Review of Systems Pertinent items are noted in HPI.    Objective:    BP 140/86 (BP Location: Right Arm, Patient Position: Sitting, Cuff Size: Large)   Pulse 80   Ht 5\' 4"  (1.626 m)   Wt 240 lb (108.9 kg)   BMI 41.20 kg/m   General:  alertno distress   Affect & Behavior:  full facial expressions, good grooming, good insight, normal perception, normal reasoning, normal speech pattern and content and normal thought patterns       Assessment:    Depression, controlled      Plan:    Medications: Lexapro. Follow up: 6 months. Spent 15 minutes (>50% of visit) discussing the risks of anxiety disorder, panic attacks and sleep disturbance, the  pathophysiology, etiology, risks, and principles of treatment.    Discussed smoking cessation in detail

## 2018-02-17 ENCOUNTER — Other Ambulatory Visit: Payer: Self-pay | Admitting: Obstetrics & Gynecology

## 2018-10-09 ENCOUNTER — Telehealth: Payer: Self-pay | Admitting: Obstetrics & Gynecology

## 2018-10-09 NOTE — Telephone Encounter (Signed)
Patient called, scheduled an appointment for 10/19/18 to discuss a medication refill.  She stated she will run out of her antidepressant.  She wants to make sure she'll be ok until her appointment.  She requested Dr. Elonda Husky and couldn't come to the other appointments she was offered.  [phone]

## 2018-10-10 ENCOUNTER — Telehealth: Payer: Self-pay | Admitting: Obstetrics & Gynecology

## 2018-10-10 MED ORDER — ESCITALOPRAM OXALATE 20 MG PO TABS: 20.0000 mg | ORAL_TABLET | Freq: Every day | ORAL | 5 refills | Status: DC

## 2018-10-10 NOTE — Telephone Encounter (Signed)
done

## 2018-10-10 NOTE — Telephone Encounter (Signed)
Meds ordered this encounter  Medications  . escitalopram (LEXAPRO) 20 MG tablet    Sig: Take 1 tablet (20 mg total) by mouth daily.    Dispense:  30 tablet    Refill:  5

## 2018-10-10 NOTE — Telephone Encounter (Signed)
Requesting a refill on Lexapro.  Has an appt on 11/7 with [facility] but will be out of medication before then.  Please advise.

## 2018-10-19 ENCOUNTER — Encounter: Payer: Self-pay | Admitting: Obstetrics & Gynecology

## 2018-10-19 ENCOUNTER — Encounter (INDEPENDENT_AMBULATORY_CARE_PROVIDER_SITE_OTHER): Payer: Self-pay

## 2018-10-19 ENCOUNTER — Ambulatory Visit (INDEPENDENT_AMBULATORY_CARE_PROVIDER_SITE_OTHER): Payer: Self-pay | Admitting: Obstetrics & Gynecology

## 2018-10-19 VITALS — BP 133/79 | HR 75 | Ht 64.0 in | Wt 235.0 lb

## 2018-10-19 DIAGNOSIS — F325 Major depressive disorder, single episode, in full remission: Secondary | ICD-10-CM

## 2018-10-19 NOTE — Progress Notes (Signed)
Subjective:     Andrea Benitez is a 57 y.o. female who presents for follow up of depression. Current symptoms include none. Symptoms have been completely resolved/controlled since that time. Patient denies depressed mood, difficulty concentrating, fatigue, feelings of worthlessness/guilt, hopelessness, hypersomnia, impaired memory, insomnia, psychomotor agitation, psychomotor retardation, recurrent thoughts of death, suicidal attempt, suicidal thoughts with specific plan, suicidal thoughts without plan, weight gain and weight loss. Previous treatment includes: medication. She complains of the following side effects from the treatment: none.  The following portions of the patient's history were reviewed and updated as appropriate: allergies, current medications, past family history, past medical history, past social history, past surgical history and problem list.  Review of Systems Pertinent items are noted in HPI.    Objective:    BP 133/79 (BP Location: Left Arm, Patient Position: Sitting, Cuff Size: Normal)   Pulse 75   Ht 5\' 4"  (1.626 m)   Wt 235 lb (106.6 kg)   BMI 40.34 kg/m   General:  alert, cooperative and no distress  Affect & Behavior:  full facial expressions, good grooming, good insight, normal perception, normal reasoning, normal speech pattern and content and normal thought patterns none      Assessment:    Depression, controlled      Plan:    Medications: Lexapro. Follow up: 6 months. Spent 15 minutes (>50% of visit) discussing the risks of depression, the  pathophysiology, etiology, risks, and principles of treatment.

## 2019-01-12 ENCOUNTER — Other Ambulatory Visit (HOSPITAL_COMMUNITY): Payer: Self-pay | Admitting: Nurse Practitioner

## 2019-01-12 DIAGNOSIS — Z1231 Encounter for screening mammogram for malignant neoplasm of breast: Secondary | ICD-10-CM

## 2019-02-05 ENCOUNTER — Ambulatory Visit (HOSPITAL_COMMUNITY)
Admission: RE | Admit: 2019-02-05 | Discharge: 2019-02-05 | Disposition: A | Discharge status: Home / Self Care | Payer: Self-pay | Source: Ambulatory Visit | Attending: Nurse Practitioner | Admitting: Nurse Practitioner

## 2019-02-05 ENCOUNTER — Other Ambulatory Visit (HOSPITAL_COMMUNITY): Payer: Self-pay | Admitting: Nurse Practitioner

## 2019-02-05 DIAGNOSIS — Z1231 Encounter for screening mammogram for malignant neoplasm of breast: Secondary | ICD-10-CM | POA: Insufficient documentation

## 2019-02-13 ENCOUNTER — Other Ambulatory Visit (HOSPITAL_COMMUNITY): Payer: Self-pay | Admitting: Physician Assistant

## 2019-02-13 DIAGNOSIS — N63 Unspecified lump in unspecified breast: Secondary | ICD-10-CM

## 2019-02-13 DIAGNOSIS — IMO0002 Reserved for concepts with insufficient information to code with codable children: Secondary | ICD-10-CM

## 2019-02-13 DIAGNOSIS — R229 Localized swelling, mass and lump, unspecified: Principal | ICD-10-CM

## 2019-02-27 ENCOUNTER — Other Ambulatory Visit: Payer: Self-pay

## 2019-02-27 ENCOUNTER — Ambulatory Visit (HOSPITAL_COMMUNITY)
Admission: RE | Admit: 2019-02-27 | Discharge: 2019-02-27 | Disposition: A | Discharge status: Home / Self Care | Payer: Self-pay | Source: Ambulatory Visit | Attending: Physician Assistant | Admitting: Physician Assistant

## 2019-02-27 ENCOUNTER — Encounter (HOSPITAL_COMMUNITY): Payer: Self-pay

## 2019-02-27 DIAGNOSIS — N63 Unspecified lump in unspecified breast: Secondary | ICD-10-CM | POA: Insufficient documentation

## 2019-02-27 DIAGNOSIS — IMO0002 Reserved for concepts with insufficient information to code with codable children: Secondary | ICD-10-CM

## 2019-02-27 DIAGNOSIS — R229 Localized swelling, mass and lump, unspecified: Secondary | ICD-10-CM | POA: Insufficient documentation

## 2019-02-28 ENCOUNTER — Other Ambulatory Visit (HOSPITAL_COMMUNITY): Payer: Self-pay | Admitting: *Deleted

## 2019-02-28 DIAGNOSIS — N6310 Unspecified lump in the right breast, unspecified quadrant: Secondary | ICD-10-CM

## 2019-03-20 ENCOUNTER — Other Ambulatory Visit (HOSPITAL_COMMUNITY): Payer: Self-pay | Admitting: *Deleted

## 2019-03-20 ENCOUNTER — Encounter (HOSPITAL_COMMUNITY): Payer: Self-pay

## 2019-03-20 ENCOUNTER — Other Ambulatory Visit: Payer: Self-pay

## 2019-03-20 ENCOUNTER — Ambulatory Visit (HOSPITAL_COMMUNITY)
Admission: RE | Admit: 2019-03-20 | Discharge: 2019-03-20 | Disposition: A | Discharge status: Home / Self Care | Payer: PRIVATE HEALTH INSURANCE | Source: Ambulatory Visit | Attending: *Deleted | Admitting: *Deleted

## 2019-03-20 DIAGNOSIS — R928 Other abnormal and inconclusive findings on diagnostic imaging of breast: Secondary | ICD-10-CM | POA: Diagnosis present

## 2019-03-20 DIAGNOSIS — N6310 Unspecified lump in the right breast, unspecified quadrant: Secondary | ICD-10-CM | POA: Insufficient documentation

## 2019-03-20 MED ORDER — LIDOCAINE HCL (PF) 1 % IJ SOLN
INTRAMUSCULAR | Status: AC
  Administered 2019-03-20: 5 mL
  Filled 2019-03-20: qty 5

## 2019-03-20 MED ORDER — LIDOCAINE-EPINEPHRINE (PF) 1 %-1:200000 IJ SOLN
INTRAMUSCULAR | Status: AC
  Administered 2019-03-20: 4 mL
  Filled 2019-03-20: qty 30

## 2019-10-16 ENCOUNTER — Other Ambulatory Visit (HOSPITAL_COMMUNITY): Payer: Self-pay | Admitting: *Deleted

## 2019-10-16 DIAGNOSIS — Z1231 Encounter for screening mammogram for malignant neoplasm of breast: Secondary | ICD-10-CM

## 2019-11-27 ENCOUNTER — Other Ambulatory Visit: Payer: Self-pay | Admitting: Obstetrics & Gynecology

## 2020-02-11 ENCOUNTER — Ambulatory Visit (HOSPITAL_COMMUNITY): Payer: Self-pay

## 2020-02-11 ENCOUNTER — Encounter (HOSPITAL_COMMUNITY): Payer: Self-pay

## 2020-04-02 ENCOUNTER — Ambulatory Visit (HOSPITAL_COMMUNITY)
Admission: RE | Admit: 2020-04-02 | Discharge: 2020-04-02 | Disposition: A | Discharge status: Home / Self Care | Payer: PRIVATE HEALTH INSURANCE | Source: Ambulatory Visit | Attending: *Deleted | Admitting: *Deleted

## 2020-04-02 ENCOUNTER — Other Ambulatory Visit: Payer: Self-pay

## 2020-04-02 DIAGNOSIS — Z1231 Encounter for screening mammogram for malignant neoplasm of breast: Secondary | ICD-10-CM | POA: Insufficient documentation

## 2020-10-23 ENCOUNTER — Other Ambulatory Visit: Payer: Self-pay | Admitting: Obstetrics & Gynecology

## 2020-12-23 ENCOUNTER — Telehealth: Payer: Self-pay | Admitting: Obstetrics & Gynecology

## 2020-12-23 NOTE — Telephone Encounter (Signed)
Patient called and wanted to know if she could get her anti-depressant prescription refilled and also stated that she knows it's been awhile since her last visit. Patient is currently waiting on Covid Test results. Per Patient. Clinical staff will follow up with patient.

## 2020-12-24 NOTE — Telephone Encounter (Signed)
Called patient back and left message that I am returning her call.  

## 2021-03-06 ENCOUNTER — Other Ambulatory Visit (HOSPITAL_COMMUNITY): Payer: Self-pay | Admitting: Physician Assistant

## 2021-03-06 DIAGNOSIS — Z1231 Encounter for screening mammogram for malignant neoplasm of breast: Secondary | ICD-10-CM

## 2021-04-06 ENCOUNTER — Ambulatory Visit (HOSPITAL_COMMUNITY)
Admission: RE | Admit: 2021-04-06 | Discharge: 2021-04-06 | Disposition: A | Discharge status: Home / Self Care | Payer: Self-pay | Source: Ambulatory Visit | Attending: Physician Assistant | Admitting: Physician Assistant

## 2021-04-06 DIAGNOSIS — Z1231 Encounter for screening mammogram for malignant neoplasm of breast: Secondary | ICD-10-CM | POA: Insufficient documentation

## 2021-10-09 ENCOUNTER — Encounter (HOSPITAL_COMMUNITY): Payer: Self-pay | Admitting: Emergency Medicine

## 2021-10-09 DIAGNOSIS — L03211 Cellulitis of face: Secondary | ICD-10-CM | POA: Insufficient documentation

## 2021-10-09 DIAGNOSIS — F17210 Nicotine dependence, cigarettes, uncomplicated: Secondary | ICD-10-CM | POA: Insufficient documentation

## 2021-10-09 DIAGNOSIS — Z79899 Other long term (current) drug therapy: Secondary | ICD-10-CM | POA: Insufficient documentation

## 2021-10-09 NOTE — ED Triage Notes (Addendum)
Swelling to LT side of face that started today, pt thinks its coming from dental problem.  Also reports sinus congestion and cough for the past couple of days.

## 2021-10-10 ENCOUNTER — Emergency Department (HOSPITAL_COMMUNITY)
Admission: EM | Admit: 2021-10-10 | Discharge: 2021-10-10 | Disposition: A | Discharge status: Home / Self Care | Payer: Self-pay | Attending: Emergency Medicine | Admitting: Emergency Medicine

## 2021-10-10 DIAGNOSIS — L03211 Cellulitis of face: Secondary | ICD-10-CM

## 2021-10-10 MED ORDER — AMOXICILLIN-POT CLAVULANATE 875-125 MG PO TABS
1.0000 | ORAL_TABLET | Freq: Once | ORAL | Status: AC
  Administered 2021-10-10: 1 via ORAL
  Filled 2021-10-10: qty 1

## 2021-10-10 MED ORDER — AMOXICILLIN-POT CLAVULANATE 875-125 MG PO TABS: 1.0000 | ORAL_TABLET | Freq: Two times a day (BID) | ORAL | 0 refills | Status: AC

## 2021-10-10 MED ORDER — NETI POT SINUS WASH 2300-700 MG NA KIT: 1.0000 | PACK | Freq: Every day | NASAL | 0 refills | Status: DC

## 2021-10-10 NOTE — Discharge Instructions (Signed)
You appear to have developed an infection of your face. I don't think it extends behind your eye.  The way to know if it spreads behind your eye is you start having eye pain, change in vision, worsening pain with range of motion of your eye or your eye starts to stick out further than previously.  I do not know if the cellulitis is separate from your sinus infection or not but antibiotic should cover for both.  Cellulitis does not go away immediately.  It might take 48 hours before it stops getting worse and could take up to 72 hours before it starts to get better.  If is not get better by 72 hours and please return to nearest emergency room for reevaluation.  Also reconsideration of doing a CT scan.  If you have any of the other above findings you need to return immediately.  Have any other concerns please return immediately.

## 2021-10-10 NOTE — ED Provider Notes (Addendum)
[facility] EMERGENCY DEPARTMENT Provider Note   CSN: [id number] Arrival date & time: 10/09/21  2016     History Chief Complaint  Patient presents with   Facial Swelling    Andrea Benitez is a 60 y.o. female.  60 year old female had been having 4 to 5-day of sinusitis symptoms.  This morning she noticed that the loss of her face started get more swollen and painful.  No pain around her eyes.  No pain with extraocular movements.  No vision changes.  No proptosis.  No other associated symptoms.       Past Medical History:  Diagnosis Date   Depression    Thyroid disease     Patient Active Problem List   Diagnosis Date Noted   Depressive disorder, not elsewhere classified 10/30/2013    Past Surgical History:  Procedure Laterality Date   CHOLECYSTECTOMY       OB History     Gravida  3   Para      Term      Preterm      AB      Living  3      SAB      IAB      Ectopic      Multiple      Live Births              No family history on file.  Social History   Tobacco Use   Smoking status: Every Day    Packs/day: 1.00    Types: Cigarettes   Smokeless tobacco: Never  Vaping Use   Vaping Use: Never used  Substance Use Topics   Alcohol use: No   Drug use: No    Home Medications Prior to Admission medications   Medication Sig Start Date End Date Taking? Authorizing Provider  amoxicillin-clavulanate (AUGMENTIN) 875-125 MG tablet Take 1 tablet by mouth 2 (two) times daily for 10 days. One po bid x 7 days 10/10/21 10/20/21 Yes Takuya Lariccia, Corene Cornea, MD  Sodium Chloride-Sodium Bicarb (NETI POT SINUS [place]) 2300-700 MG KIT Place 1 kit into the nose daily. 10/10/21  Yes Genevia Bouldin, Corene Cornea, MD  escitalopram (LEXAPRO) 20 MG tablet TAKE ONE TABLET BY MOUTH DAILY. 10/23/20   Florian Buff, MD  levothyroxine (SYNTHROID, LEVOTHROID) 150 MCG tablet Take 150 mcg by mouth daily before breakfast.    [provider]  Thyroid (NATURE-THROID) 97.5 MG TABS Take  1 tablet (97.5 mg total) by mouth daily. Patient not taking: Reported on 03/29/2017 03/11/16   Florian Buff, MD    Allergies    Patient has no known allergies.  Review of Systems   Review of Systems  All other systems reviewed and are negative.  Physical Exam Updated Vital Signs BP (!) 157/79 (BP Location: Right Arm)   Pulse 81   Temp 97.9 F (36.6 C) (Oral)   Resp 18   Ht _0  (1.626 m)   Wt 99.8 kg   SpO2 95%   BMI 37.76 kg/m   Physical Exam Vitals and nursing note reviewed.  Constitutional:      Appearance: She is well-developed.  HENT:     Head: Normocephalic and atraumatic.     Comments: Induration, erythema, tenderness and edema to the left nasolabial fold all the way up to around the area of the infraorbital nerve.  Has decreased sensation to the same area.    Nose: Nose normal. No congestion or rhinorrhea.     Mouth/Throat:  Mouth: Mucous membranes are moist.     Pharynx: Oropharynx is clear.  Eyes:     Pupils: Pupils are equal, round, and reactive to light.     Comments: EOM intact.  No proptosis.  Pupils equal round reactive to light.  Cardiovascular:     Rate and Rhythm: Normal rate and regular rhythm.  Pulmonary:     Effort: No respiratory distress.     Breath sounds: No stridor.  Abdominal:     General: Abdomen is flat. There is no distension.  Musculoskeletal:        General: No swelling or tenderness. Normal range of motion.     Cervical back: Normal range of motion.  Skin:    General: Skin is warm and dry.     Coloration: Skin is not jaundiced or pale.  Neurological:     General: No focal deficit present.     Mental Status: She is alert.    ED Results / Procedures / Treatments   Labs (all labs ordered are listed, but only abnormal results are displayed) Labs Reviewed - No data to display  EKG None  Radiology No results found.  Procedures Procedures   Medications Ordered in ED Medications  amoxicillin-clavulanate (AUGMENTIN)  875-125 MG per tablet 1 tablet (1 tablet Oral Given 10/10/21 0124)    ED Course  I have reviewed the triage vital signs and the nursing notes.  Pertinent labs & imaging results that were available during my care of the patient were reviewed by me and considered in my medical decision making (see chart for details).    MDM Rules/Calculators/A&P                         Facial cellulitis. Possibly an element of preseptal cellulitis as well. No e/o orbital cellulitis.  Advised facial ct to eval for destructive process from sinusitis. Patient does not have insurance and prefers to defer imaging at this time.  Will initiate antibitotics, close fu here if worsening or not improving as expected.   Final Clinical Impression(s) / ED Diagnoses Final diagnoses:  Facial cellulitis    Rx / DC Orders ED Discharge Orders          Ordered    amoxicillin-clavulanate (AUGMENTIN) 875-125 MG tablet  2 times daily        10/10/21 0143    Sodium Chloride-Sodium Bicarb (NETI POT SINUS [place]) 2300-700 MG KIT  Daily        10/10/21 0143             Keyshun Elpers, Corene Cornea, MD 10/10/21 0037    Merrily Pew, MD 10/10/21 [phone]

## 2021-10-14 ENCOUNTER — Other Ambulatory Visit: Payer: Self-pay | Admitting: Obstetrics & Gynecology

## 2022-07-18 IMAGING — MG MM DIGITAL SCREENING BILAT W/ TOMO AND CAD
8 series · 8 of 24 positions shown · non-contrast
Comparison: Previous exam(s).

CLINICAL DATA: Screening.

EXAM:
DIGITAL SCREENING BILATERAL MAMMOGRAM WITH TOMOSYNTHESIS AND CAD
TECHNIQUE: Bilateral screening digital craniocaudal and mediolateral oblique
mammograms were obtained. Bilateral screening digital breast
tomosynthesis was performed. The images were evaluated with
computer-aided detection.

[R CC synth-2D]
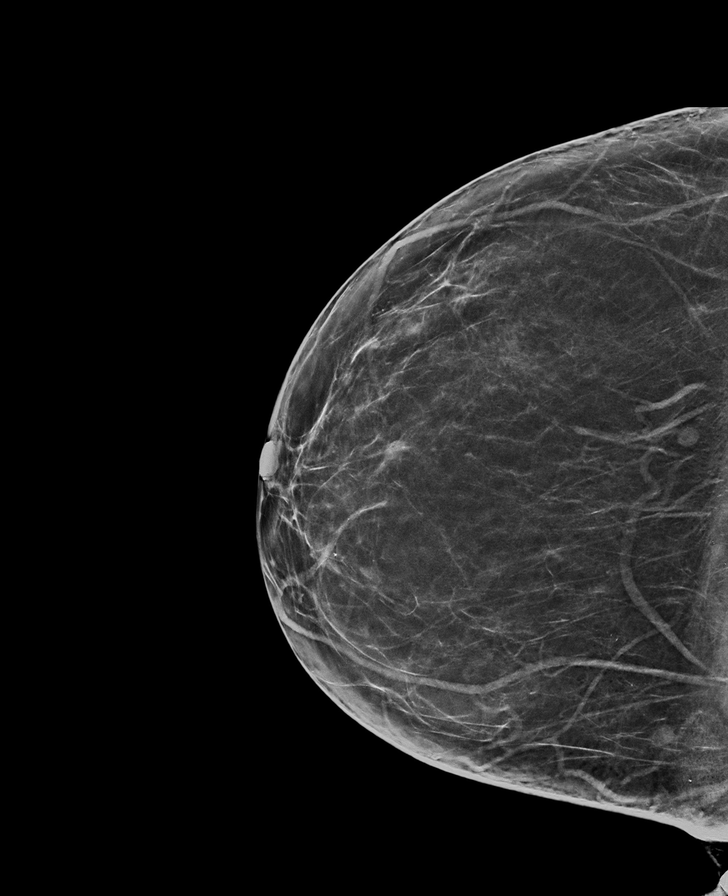

[R MLO synth-2D]
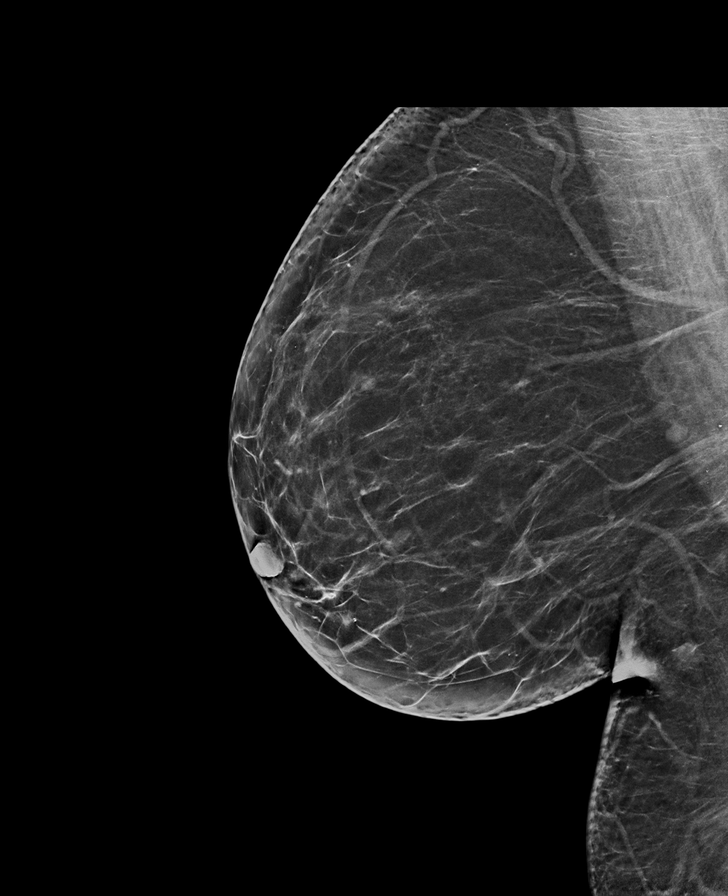

[L MLO synth-2D]
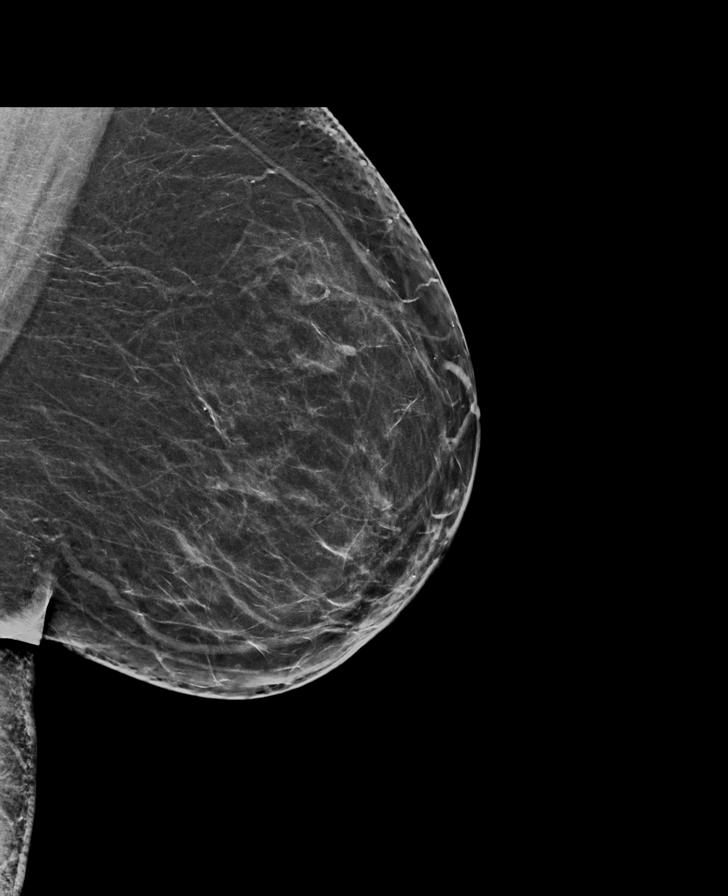

[L CC synth-2D]
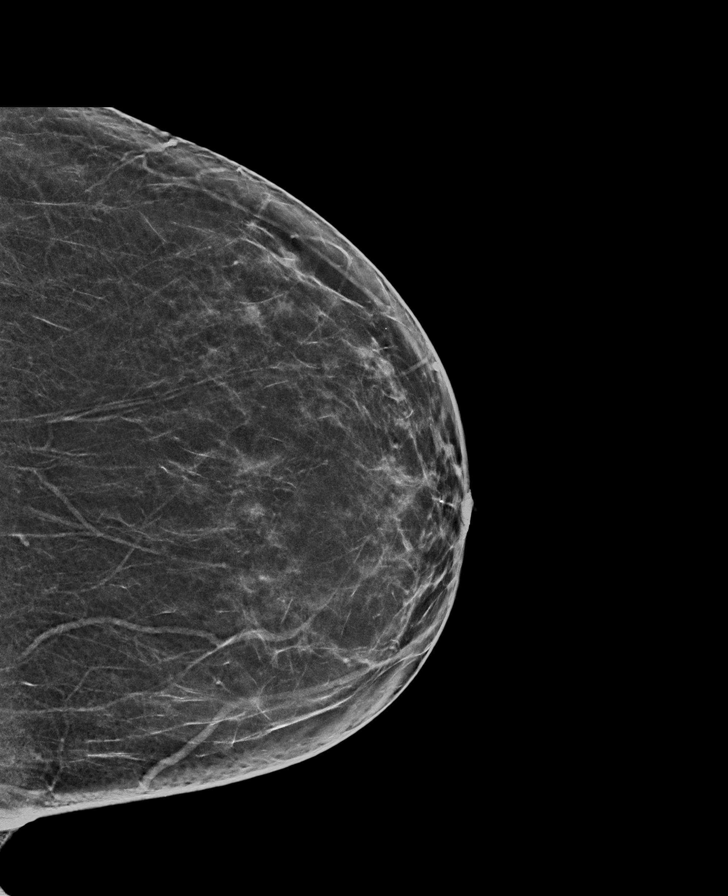

[L CC tomo · tomo slice 35/69.0]
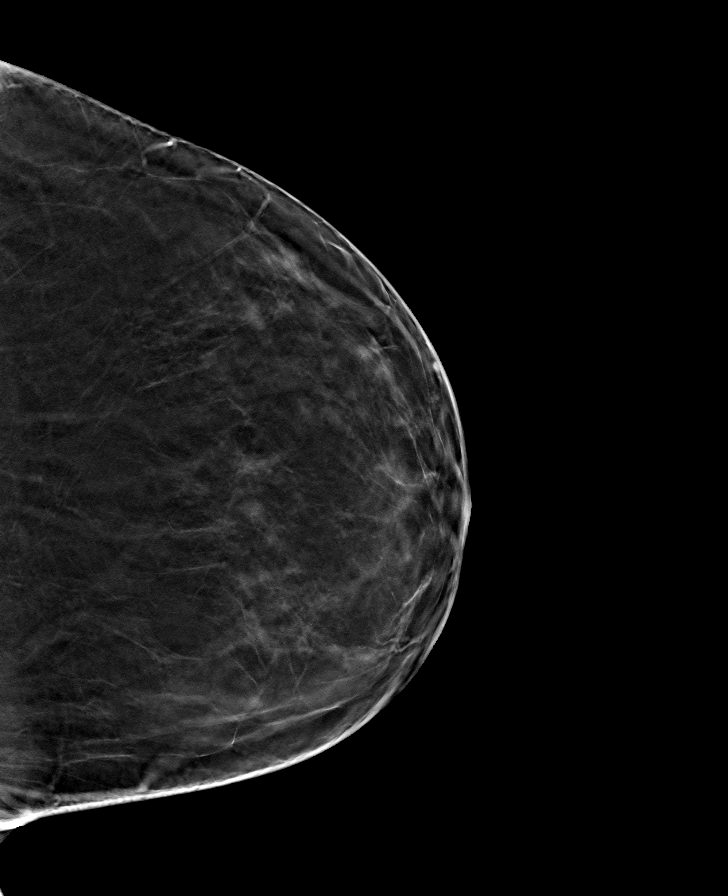

[R MLO tomo · tomo slice 41/81.0]
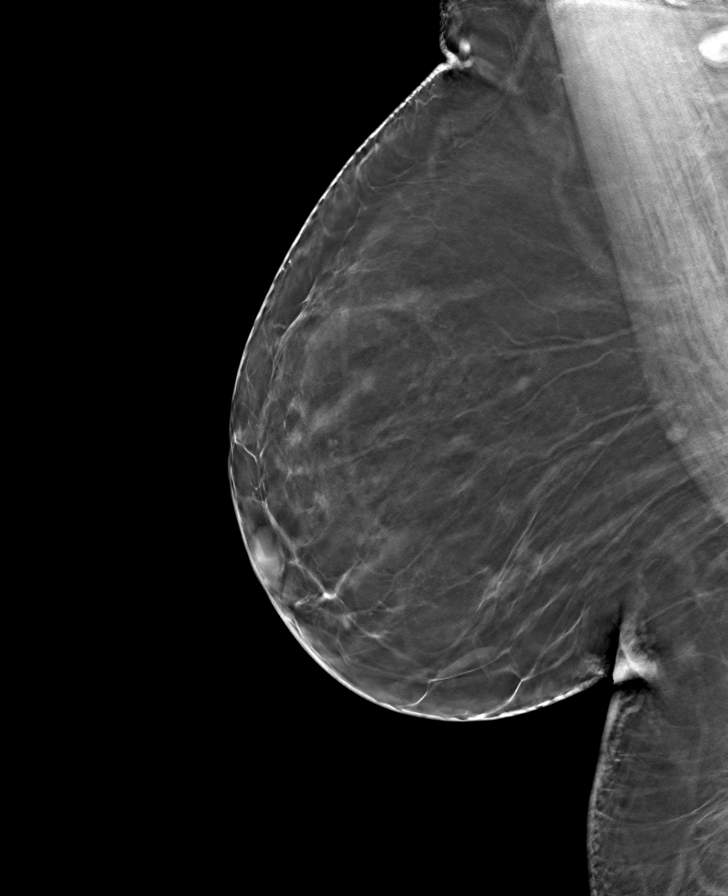

[R CC tomo · tomo slice 37/73.0]
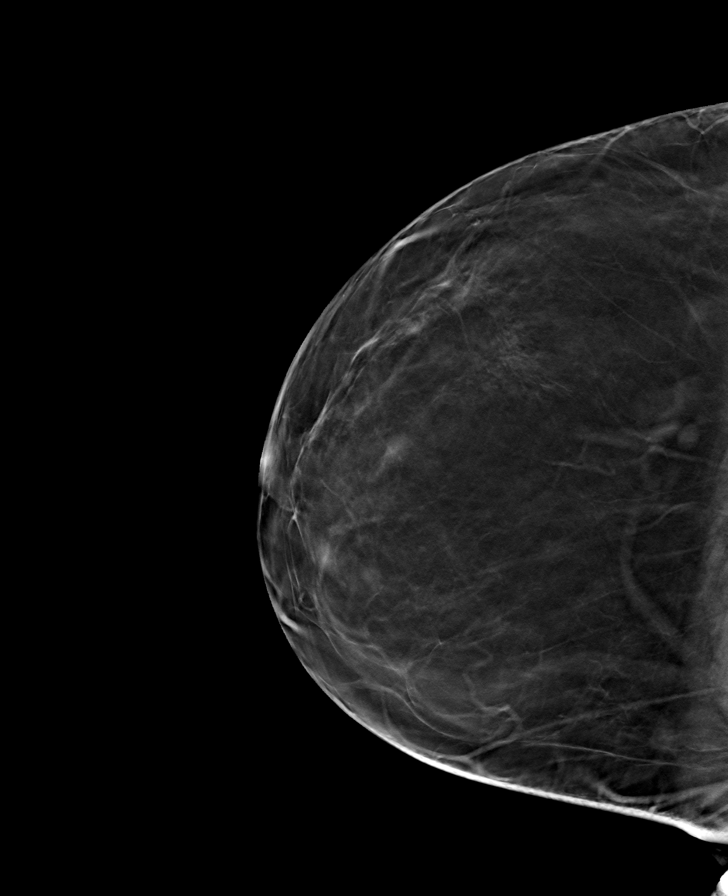

[L MLO tomo · tomo slice 38/75.0]
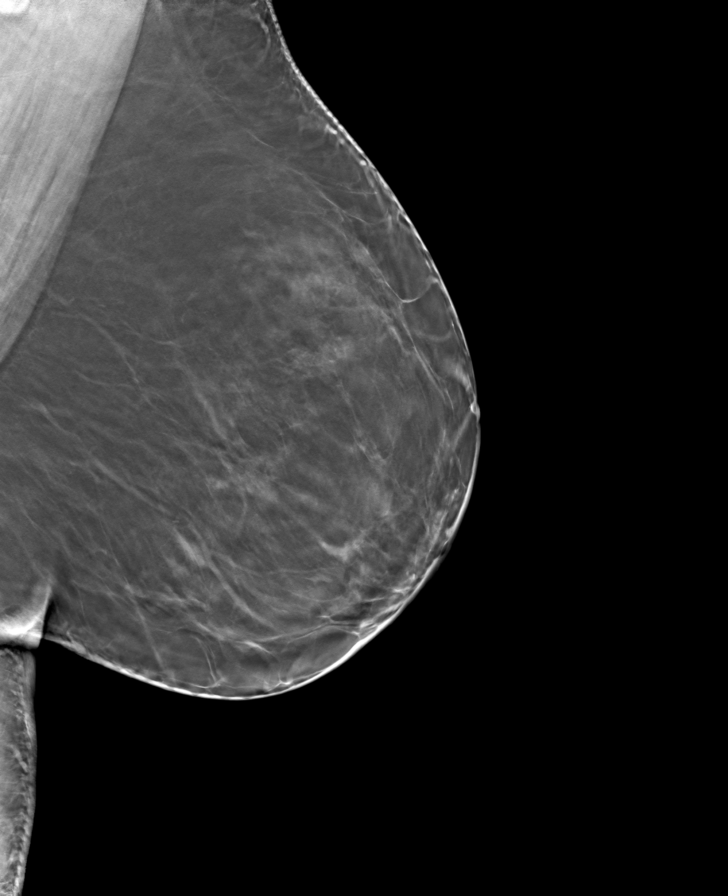

[8 of 24 positions shown; findings below may reference images not displayed]

ACR Breast Density Category b: There are scattered areas of
fibroglandular density.
FINDINGS: There are no findings suspicious for malignancy. The images were
evaluated with computer-aided detection.
IMPRESSION: No mammographic evidence of malignancy. A result letter of this
screening mammogram will be mailed directly to the patient.

RECOMMENDATION:
Screening mammogram in one year. (Code:WJ-I-BG6)

BI-RADS CATEGORY  1: Negative.

## 2022-08-25 ENCOUNTER — Telehealth: Payer: Self-pay

## 2022-08-25 NOTE — Telephone Encounter (Signed)
Telephoned patient at home number. Left a voice message with [facility] contact information. 

## 2022-08-26 ENCOUNTER — Other Ambulatory Visit (HOSPITAL_COMMUNITY): Payer: Self-pay | Admitting: Obstetrics and Gynecology

## 2022-08-26 DIAGNOSIS — Z1231 Encounter for screening mammogram for malignant neoplasm of breast: Secondary | ICD-10-CM

## 2022-09-27 ENCOUNTER — Encounter: Payer: Self-pay | Admitting: Internal Medicine

## 2022-10-20 ENCOUNTER — Ambulatory Visit: Payer: Self-pay | Admitting: Gastroenterology

## 2022-10-29 ENCOUNTER — Inpatient Hospital Stay: Payer: Self-pay | Attending: Hematology | Admitting: Hematology and Oncology

## 2022-10-29 ENCOUNTER — Other Ambulatory Visit: Payer: Self-pay | Admitting: Obstetrics & Gynecology

## 2022-10-29 ENCOUNTER — Ambulatory Visit (HOSPITAL_COMMUNITY)
Admission: RE | Admit: 2022-10-29 | Discharge: 2022-10-29 | Disposition: A | Discharge status: Home / Self Care | Payer: Self-pay | Source: Ambulatory Visit | Attending: Obstetrics and Gynecology | Admitting: Obstetrics and Gynecology

## 2022-10-29 ENCOUNTER — Other Ambulatory Visit (HOSPITAL_COMMUNITY): Admission: RE | Admit: 2022-10-29 | Payer: Self-pay | Source: Ambulatory Visit

## 2022-10-29 ENCOUNTER — Other Ambulatory Visit: Payer: Self-pay

## 2022-10-29 VITALS — BP 135/93 | Wt 207.6 lb

## 2022-10-29 DIAGNOSIS — Z01419 Encounter for gynecological examination (general) (routine) without abnormal findings: Secondary | ICD-10-CM

## 2022-10-29 DIAGNOSIS — Z124 Encounter for screening for malignant neoplasm of cervix: Secondary | ICD-10-CM

## 2022-10-29 DIAGNOSIS — Z1231 Encounter for screening mammogram for malignant neoplasm of breast: Secondary | ICD-10-CM

## 2022-10-29 NOTE — Progress Notes (Signed)
Andrea Benitez is a 61 y.o. G3P0 female who presents to [facility] clinic today with no complaints.    Pap Smear: Pap smear completed today. Last Pap smear was 2020 at [facility] clinic and was normal. Per patient has no history of an abnormal Pap smear. Last Pap smear result is not available in Epic.   Physical exam: Breasts Breasts symmetrical. No skin abnormalities bilateral breasts. No nipple retraction bilateral breasts. No nipple discharge bilateral breasts. No lymphadenopathy. No lumps palpated bilateral breasts.     MS DIGITAL SCREENING TOMO BILATERAL  Result Date: 04/06/2021 CLINICAL DATA:  Screening. EXAM: DIGITAL SCREENING BILATERAL MAMMOGRAM WITH TOMOSYNTHESIS AND CAD TECHNIQUE: Bilateral screening digital craniocaudal and mediolateral oblique mammograms were obtained. Bilateral screening digital breast tomosynthesis was performed. The images were evaluated with computer-aided detection. COMPARISON:  Previous exam(s). ACR Breast Density Category b: There are scattered areas of fibroglandular density. FINDINGS: There are no findings suspicious for malignancy. The images were evaluated with computer-aided detection. IMPRESSION: No mammographic evidence of malignancy. A result letter of this screening mammogram will be mailed directly to the patient. RECOMMENDATION: Screening mammogram in one year. (Code:SM-B-01Y) BI-RADS CATEGORY  1: Negative. Electronically Signed   By: Fidela Salisbury M.D.   On: 04/06/2021 16:16   MM 3D SCREEN BREAST BILATERAL  Result Date: 04/02/2020 CLINICAL DATA:  Screening. EXAM: DIGITAL SCREENING BILATERAL MAMMOGRAM WITH TOMO AND CAD COMPARISON:  Previous exam(s). ACR Breast Density Category b: There are scattered areas of fibroglandular density. FINDINGS: There are no findings suspicious for malignancy. Images were processed with CAD. IMPRESSION: No mammographic evidence of malignancy. A result letter of this screening mammogram will be mailed directly to the patient.  RECOMMENDATION: Screening mammogram in one year. (Code:SM-B-01Y) BI-RADS CATEGORY  1: Negative. Electronically Signed   By: Ammie Ferrier M.D.   On: 04/02/2020 11:40   MM CLIP PLACEMENT RIGHT  Result Date: 06/25/2019 CLINICAL DATA:  Evaluate right breast mass after aspiration. EXAM: DIAGNOSTIC RIGHT MAMMOGRAM POST ULTRASOUND GUIDED ASPIRATION COMPARISON:  Previous exam(s). FINDINGS: Mammographic images were obtained following ultrasound guided aspiration of 2 adjacent right breast masses. Both masses have dissipated confirming the mammographically and sonographically identified masses did correlate. IMPRESSION: Complete resolution of the aspirated right breast masses. Final Assessment: Post Procedure Mammograms for Marker Placement Electronically Signed   By: Dorise Bullion III M.D   On: 03/20/2019 13:40   MS DIGITAL SCREENING TOMO BILATERAL  Result Date: 02/05/2019 CLINICAL DATA:  Screening. EXAM: DIGITAL SCREENING BILATERAL MAMMOGRAM WITH TOMO AND CAD COMPARISON:  Previous exam(s). ACR Breast Density Category b: There are scattered areas of fibroglandular density. FINDINGS: In the right breast, a possible mass warrants further evaluation. In the left breast, no findings suspicious for malignancy. Images were processed with CAD. IMPRESSION: Further evaluation is suggested for possible mass in the right breast. RECOMMENDATION: Ultrasound of the right breast. (Code:US-R-67M) The patient will be contacted regarding the findings, and additional imaging will be scheduled. BI-RADS CATEGORY  0: Incomplete. Need additional imaging evaluation and/or prior mammograms for comparison. Electronically Signed   By: Curlene Dolphin M.D.   On: 02/05/2019 13:04      Pelvic/Bimanual Ext Genitalia No lesions, no swelling and no discharge observed on external genitalia.        Vagina Vagina pink and normal texture. No lesions or discharge observed in vagina.        Cervix Cervix is present. Cervix pink and of  normal texture. No discharge observed.    Uterus Uterus is present and  palpable. Uterus in normal position and normal size.        Adnexae Bilateral ovaries present and palpable. No tenderness on palpation.         Rectovaginal No rectal exam completed today since patient had no rectal complaints. No skin abnormalities observed on exam.     Smoking History: Patient has is a current smoker at 1  packs per day and was referred to quit line.    Patient Navigation: Patient education provided. Access to services provided for patient through [facility] program. No interpreter provided. No transportation provided   Colorectal Cancer Screening: Per patient  she is scheduled for colonoscopy on 11/22/2022  No complaints today.    Breast and Cervical Cancer Risk Assessment: Patient has family history of breast cancer, with her maternal Great Aunt. Patient does not have history of cervical dysplasia, immunocompromised, or DES exposure in-utero.  Risk Assessment   No risk assessment data     A: [facility] exam with pap smear No complaints with benign exam.   P: [facility]. Appointment scheduled 10/29/22.  Melodye Ped, NP 10/29/2022 10:47 AM

## 2022-10-29 NOTE — Patient Instructions (Signed)
Taught Andrea Benitez about self breast awareness. Patient did need a Pap smear today due to last Pap smear was in 2020 per patient. Let her know [facility] will cover Pap smears every 5 years unless has a history of abnormal Pap smears. [facility]. Appointment scheduled for 10/29/22. Patient aware of appointment and will be there. Let patient know will follow up with her within the next couple weeks with results. Andrea Benitez verbalized understanding.  Melodye Ped, NP 10:50 AM

## 2022-11-03 LAB — CYTOLOGY - PAP
Comment: NEGATIVE
Diagnosis: NEGATIVE
Diagnosis: REACTIVE
High risk HPV: NEGATIVE

## 2022-11-11 ENCOUNTER — Telehealth: Payer: Self-pay

## 2022-11-11 NOTE — Telephone Encounter (Signed)
Patient informed negative Pap/HPV results, next pap due in 5 years, verbalized understanding.

## 2022-11-20 NOTE — Progress Notes (Unsigned)
GI Office Note    Referring Provider: Raiford Simmonds., PA-C Primary Care Physician:  Raiford Simmonds., PA-C  Primary Gastroenterologist: Cristopher Estimable.Rourk, MD   Chief Complaint   No chief complaint on file.   History of Present Illness   Andrea Benitez is a 60 y.o. female presenting today at the request of Muse, Rochelle D., PA-C for ***constipation, heme positive stool, and need for colonoscopy  No prior endoscopies on file.  Referral paperwork notes intermittent constipation, 1/3 stool samples heme positive which was found for colon cancer screening.  Labs 08/27/2022: A1c 6.8, HDL 45, triglycerides 164, LDL 105, normal LFTs, normal hemoglobin   Today: Constipation -   Family history of colon cancer/polyps - ?    Current Outpatient Medications  Medication Sig Dispense Refill   escitalopram (LEXAPRO) 20 MG tablet TAKE ONE TABLET BY MOUTH DAILY. 30 tablet 5   levothyroxine (SYNTHROID, LEVOTHROID) 150 MCG tablet Take 150 mcg by mouth daily before breakfast.     metFORMIN (GLUCOPHAGE) 500 MG tablet Take 500 mg by mouth 2 (two) times daily with a meal. 1/2 tablet po bid     Sodium Chloride-Sodium Bicarb (NETI POT SINUS [place]) 2300-700 MG KIT Place 1 kit into the nose daily. 7 kit 0   Thyroid (NATURE-THROID) 97.5 MG TABS Take 1 tablet (97.5 mg total) by mouth daily. (Patient not taking: Reported on 03/29/2017) 30 tablet 11   No current facility-administered medications for this visit.    Past Medical History:  Diagnosis Date   Depression    Thyroid disease     Past Surgical History:  Procedure Laterality Date   CHOLECYSTECTOMY      No family history on file.  Allergies as of 11/22/2022   (No Known Allergies)    Social History   Socioeconomic History   Marital status: Married    Spouse name: Not on file   Number of children: 3   Years of education: Not on file   Highest education level: 11th grade  Occupational History   Not on file  Tobacco Use    Smoking status: Every Day    Packs/day: 1.00    Types: Cigarettes   Smokeless tobacco: Never  Vaping Use   Vaping Use: Never used  Substance and Sexual Activity   Alcohol use: No   Drug use: No   Sexual activity: Yes    Birth control/protection: Post-menopausal  Other Topics Concern   Not on file  Social History Narrative   Not on file   Social Determinants of Health   Financial Resource Strain: Not on file  Food Insecurity: No Food Insecurity (10/29/2022)   Hunger Vital Sign    Worried About Running Out of Food in the Last Year: Never true    Ran Out of Food in the Last Year: Never true  Transportation Needs: No Transportation Needs (10/29/2022)   PRAPARE - Hydrologist (Medical): No    Lack of Transportation (Non-Medical): No  Physical Activity: Not on file  Stress: Not on file  Social Connections: Not on file  Intimate Partner Violence: Not on file     Review of Systems   Gen: Denies any fever, chills, fatigue, weight loss, lack of appetite.  CV: Denies chest pain, heart palpitations, peripheral edema, syncope.  Resp: Denies shortness of breath at rest or with exertion. Denies wheezing or cough.  GI: see HPI GU : Denies urinary burning, urinary frequency, urinary hesitancy MS: Denies joint  pain, muscle weakness, cramps, or limitation of movement.  Derm: Denies rash, itching, dry skin Psych: Denies depression, anxiety, memory loss, and confusion Heme: Denies bruising, bleeding, and enlarged lymph nodes.   Physical Exam   There were no vitals taken for this visit.  General:   Alert and oriented. Pleasant and cooperative. Well-nourished and well-developed.  Head:  Normocephalic and atraumatic. Eyes:  Without icterus, sclera clear and conjunctiva pink.  Ears:  Normal auditory acuity. Mouth:  No deformity or lesions, oral mucosa pink.  Lungs:  Clear to auscultation bilaterally. No wheezes, rales, or rhonchi. No distress.  Heart:  S1,  S2 present without murmurs appreciated.  Abdomen:  +BS, soft, non-tender and non-distended. No HSM noted. No guarding or rebound. No masses appreciated.  Rectal:  Deferred *** Msk:  Symmetrical without gross deformities. Normal posture. Extremities:  Without edema. Neurologic:  Alert and  oriented x4;  grossly normal neurologically. Skin:  Intact without significant lesions or rashes. Psych:  Alert and cooperative. Normal mood and affect.   Assessment   Andrea Benitez is a 60 y.o. female with a history of depression, HLD, and hypothyroidism*** presenting today for further evaluation of constipation and schedule screening colonoscopy.  Constipation:   Screening for colon cancer:    PLAN   *** Proceed with colonoscopy with propofol by Dr. Gala Romney in near future: the risks, benefits, and alternatives have been discussed with the patient in detail. The patient states understanding and desires to proceed. ASA 2/3***    Venetia Night, MSN, FNP-BC, AGACNP-BC [facility]

## 2022-11-22 ENCOUNTER — Encounter: Payer: Self-pay | Admitting: Gastroenterology

## 2022-11-22 ENCOUNTER — Telehealth: Payer: Self-pay | Admitting: *Deleted

## 2022-11-22 ENCOUNTER — Ambulatory Visit (INDEPENDENT_AMBULATORY_CARE_PROVIDER_SITE_OTHER): Payer: Self-pay | Admitting: Gastroenterology

## 2022-11-22 VITALS — BP 146/88 | HR 92 | Temp 97.7°F | Ht 64.0 in | Wt 207.2 lb

## 2022-11-22 DIAGNOSIS — Z1211 Encounter for screening for malignant neoplasm of colon: Secondary | ICD-10-CM

## 2022-11-22 DIAGNOSIS — K5900 Constipation, unspecified: Secondary | ICD-10-CM

## 2022-11-22 NOTE — Telephone Encounter (Signed)
LMTRC   Need to schedule ASA 2 TCS w/ Dr.Rourk Screening colon cancer & constipation

## 2022-11-22 NOTE — Patient Instructions (Addendum)
We are scheduling you for colonoscopy in the near future with Dr. Gala Romney.  You will receive separate instructions regarding your prep.  To help better manage her constipation, I recommend you start a daily fiber supplement with something like Benefiber.  You should begin taking 2-3 teaspoons daily in 8 ounces of any fluid of your choice.  After a few weeks to 1 month may increase to twice daily if tolerated.  If you prefer not to use powder, you may use fiber capsules/tablets or Gummies.  We will have you follow-up 2-3 months after your procedure.   I hope you have a wonderful Christmas and a happy new year!  It was a pleasure to see you today. I want to create trusting relationships with patients. If you receive a survey regarding your visit,  I greatly appreciate you taking time to fill this out on paper or through your MyChart. I value your feedback.  Venetia Night, MSN, FNP-BC, AGACNP-BC [facility]

## 2022-11-24 ENCOUNTER — Encounter: Payer: Self-pay | Admitting: *Deleted

## 2022-11-24 MED ORDER — PEG 3350-KCL-NA BICARB-NACL 420 G PO SOLR: 4000.0000 mL | Freq: Once | ORAL | 0 refills | Status: AC

## 2023-01-06 ENCOUNTER — Encounter (HOSPITAL_COMMUNITY)
Admission: RE | Disposition: A | Discharge status: Home / Self Care | Payer: Self-pay | Source: Ambulatory Visit | Attending: Internal Medicine

## 2023-01-06 ENCOUNTER — Encounter (HOSPITAL_COMMUNITY): Payer: Self-pay | Admitting: Internal Medicine

## 2023-01-06 ENCOUNTER — Ambulatory Visit (HOSPITAL_COMMUNITY)
Admission: RE | Admit: 2023-01-06 | Discharge: 2023-01-06 | Disposition: A | Discharge status: Home / Self Care | Payer: Medicaid Other | Source: Ambulatory Visit | Attending: Internal Medicine | Admitting: Internal Medicine

## 2023-01-06 ENCOUNTER — Other Ambulatory Visit: Payer: Self-pay

## 2023-01-06 ENCOUNTER — Ambulatory Visit (HOSPITAL_COMMUNITY): Payer: Medicaid Other | Admitting: Anesthesiology

## 2023-01-06 ENCOUNTER — Ambulatory Visit (HOSPITAL_BASED_OUTPATIENT_CLINIC_OR_DEPARTMENT_OTHER): Payer: Medicaid Other | Admitting: Anesthesiology

## 2023-01-06 DIAGNOSIS — F17200 Nicotine dependence, unspecified, uncomplicated: Secondary | ICD-10-CM | POA: Insufficient documentation

## 2023-01-06 DIAGNOSIS — R195 Other fecal abnormalities: Secondary | ICD-10-CM | POA: Insufficient documentation

## 2023-01-06 DIAGNOSIS — D120 Benign neoplasm of cecum: Secondary | ICD-10-CM

## 2023-01-06 DIAGNOSIS — K5730 Diverticulosis of large intestine without perforation or abscess without bleeding: Secondary | ICD-10-CM | POA: Diagnosis not present

## 2023-01-06 DIAGNOSIS — D123 Benign neoplasm of transverse colon: Secondary | ICD-10-CM

## 2023-01-06 DIAGNOSIS — E119 Type 2 diabetes mellitus without complications: Secondary | ICD-10-CM | POA: Diagnosis not present

## 2023-01-06 DIAGNOSIS — F32A Depression, unspecified: Secondary | ICD-10-CM | POA: Insufficient documentation

## 2023-01-06 DIAGNOSIS — K5900 Constipation, unspecified: Secondary | ICD-10-CM

## 2023-01-06 DIAGNOSIS — E039 Hypothyroidism, unspecified: Secondary | ICD-10-CM | POA: Insufficient documentation

## 2023-01-06 DIAGNOSIS — Z1211 Encounter for screening for malignant neoplasm of colon: Secondary | ICD-10-CM | POA: Diagnosis not present

## 2023-01-06 DIAGNOSIS — Z7984 Long term (current) use of oral hypoglycemic drugs: Secondary | ICD-10-CM | POA: Diagnosis not present

## 2023-01-06 HISTORY — PX: COLONOSCOPY WITH PROPOFOL: SHX5780

## 2023-01-06 HISTORY — PX: POLYPECTOMY: SHX5525

## 2023-01-06 HISTORY — DX: Hypothyroidism, unspecified: E03.9

## 2023-01-06 HISTORY — DX: Type 2 diabetes mellitus without complications: E11.9

## 2023-01-06 LAB — GLUCOSE, CAPILLARY: Glucose-Capillary: 121 mg/dL — ABNORMAL HIGH (ref 70–99)

## 2023-01-06 SURGERY — COLONOSCOPY WITH PROPOFOL
Anesthesia: General

## 2023-01-06 MED ORDER — IPRATROPIUM-ALBUTEROL 0.5-2.5 (3) MG/3ML IN SOLN: 3.0000 mL | Freq: Once | RESPIRATORY_TRACT | Status: AC

## 2023-01-06 MED ORDER — LIDOCAINE HCL (CARDIAC) PF 100 MG/5ML IV SOSY
PREFILLED_SYRINGE | INTRAVENOUS | Status: DC | PRN
  Administered 2023-01-06: 50 mg via INTRATRACHEAL

## 2023-01-06 MED ORDER — PROPOFOL 500 MG/50ML IV EMUL
INTRAVENOUS | Status: DC | PRN
  Administered 2023-01-06: 200 ug/kg/min via INTRAVENOUS

## 2023-01-06 MED ORDER — LACTATED RINGERS IV SOLN: INTRAVENOUS | Status: DC

## 2023-01-06 MED ORDER — IPRATROPIUM-ALBUTEROL 0.5-2.5 (3) MG/3ML IN SOLN
RESPIRATORY_TRACT | Status: AC
  Administered 2023-01-06: 3 mL via RESPIRATORY_TRACT
  Filled 2023-01-06: qty 3

## 2023-01-06 MED ORDER — PROPOFOL 10 MG/ML IV BOLUS
INTRAVENOUS | Status: DC | PRN
  Administered 2023-01-06: 10 mg via INTRAVENOUS
  Administered 2023-01-06: 90 mg via INTRAVENOUS

## 2023-01-06 MED ORDER — IPRATROPIUM-ALBUTEROL 0.5-2.5 (3) MG/3ML IN SOLN: 3.0000 mL | Freq: Once | RESPIRATORY_TRACT | Status: DC

## 2023-01-06 NOTE — H&P (Signed)
$'@LOGO'Z$ @   Primary Care Physician:  Sofie Rower, PA-C Primary Gastroenterologist:  Dr. Gala Romney  Pre-Procedure History & Physical: HPI:  Andrea Benitez is a 62 y.o. female here for further evaluation of Hemoccult positive stools via colonoscopy.  No prior colonoscopy.  Rare paper hematochezia  Past Medical History:  Diagnosis Date   Depression    Diabetes mellitus without complication ([place])    Hypothyroidism    Thyroid disease     Past Surgical History:  Procedure Laterality Date   CHOLECYSTECTOMY      Prior to Admission medications   Medication Sig Start Date End Date Taking? Authorizing Provider  escitalopram (LEXAPRO) 20 MG tablet TAKE ONE TABLET BY MOUTH DAILY. Patient taking differently: Take 10 mg by mouth daily. 10/29/22  Yes Florian Buff, MD  fluticasone furoate-vilanterol (BREO ELLIPTA) 100-25 MCG/ACT AEPB Inhale 1 puff into the lungs daily as needed (shortness of breath).   Yes [provider]  levothyroxine (SYNTHROID) 175 MCG tablet Take 175 mcg by mouth daily before breakfast.   Yes [provider]  metFORMIN (GLUCOPHAGE) 500 MG tablet Take 250 mg by mouth 2 (two) times daily with a meal.   Yes [provider]  Omega-3 Fatty Acids (FISH OIL) 1200 MG CPDR Take 1,200 mg by mouth 2 (two) times daily.   Yes [provider]    Allergies as of 11/24/2022   (No Known Allergies)    Family History  Problem Relation Age of Onset   Colon cancer Neg Hx    Colon polyps Neg Hx     Social History   Socioeconomic History   Marital status: Married    Spouse name: Not on file   Number of children: 3   Years of education: Not on file   Highest education level: 11th grade  Occupational History   Not on file  Tobacco Use   Smoking status: Every Day    Packs/day: 1.00    Types: Cigarettes   Smokeless tobacco: Never  Vaping Use   Vaping Use: Never used  Substance and Sexual Activity   Alcohol use: No   Drug use: No   Sexual  activity: Yes    Birth control/protection: Post-menopausal  Other Topics Concern   Not on file  Social History Narrative   Not on file   Social Determinants of Health   Financial Resource Strain: Not on file  Food Insecurity: No Food Insecurity (10/29/2022)   Hunger Vital Sign    Worried About Running Out of Food in the Last Year: Never true    Ran Out of Food in the Last Year: Never true  Transportation Needs: No Transportation Needs (10/29/2022)   PRAPARE - Hydrologist (Medical): No    Lack of Transportation (Non-Medical): No  Physical Activity: Not on file  Stress: Not on file  Social Connections: Not on file  Intimate Partner Violence: Not on file    Review of Systems: See HPI, otherwise negative ROS  Physical Exam: BP 119/76   Pulse 87   Temp 98.3 F (36.8 C) (Oral)   Resp 19   Ht '5\' 4"'$  (1.626 m)   Wt 93 kg   SpO2 92%   BMI 35.19 kg/m  General:   Alert,  Well-developed, well-nourished, pleasant and cooperative in NAD Neck:  Supple; no masses or thyromegaly. No significant cervical adenopathy. Lungs:  Clear throughout to auscultation.   No wheezes, crackles, or rhonchi. No acute distress. Heart:  Regular rate  and rhythm; no murmurs, clicks, rubs,  or gallops. Abdomen: Non-distended, normal bowel sounds.  Soft and nontender without appreciable mass or hepatosplenomegaly.  Pulses:  Normal pulses noted. Extremities:  Without clubbing or edema.  Impression/Plan: 62 year old lady here for first ever colonoscopy.  Hemoccult positive stool and paper hematochezia in the setting of constipation The risks, benefits, limitations, alternatives and imponderables have been reviewed with the patient. Questions have been answered. All parties are agreeable.       Notice: This dictation was prepared with Dragon dictation along with smaller phrase technology. Any transcriptional errors that result from this process are unintentional and may not be  corrected upon review.

## 2023-01-06 NOTE — Discharge Instructions (Addendum)
  Colonoscopy Discharge Instructions  Read the instructions outlined below and refer to this sheet in the next few weeks. These discharge instructions provide you with general information on caring for yourself after you leave the hospital. Your doctor may also give you specific instructions. While your treatment has been planned according to the most current medical practices available, unavoidable complications occasionally occur. If you have any problems or questions after discharge, call Dr. Gala Romney at [phone]. ACTIVITY You may resume your regular activity, but move at a slower pace for the next 24 hours.  Take frequent rest periods for the next 24 hours.  Walking will help get rid of the air and reduce the bloated feeling in your belly (abdomen).  No driving for 24 hours (because of the medicine (anesthesia) used during the test).   Do not sign any important legal documents or operate any machinery for 24 hours (because of the anesthesia used during the test).  NUTRITION Drink plenty of fluids.  You may resume your normal diet as instructed by your doctor.  Begin with a light meal and progress to your normal diet. Heavy or fried foods are harder to digest and may make you feel sick to your stomach (nauseated).  Avoid alcoholic beverages for 24 hours or as instructed.  MEDICATIONS You may resume your normal medications unless your doctor tells you otherwise.  WHAT YOU CAN EXPECT TODAY Some feelings of bloating in the abdomen.  Passage of more gas than usual.  Spotting of blood in your stool or on the toilet paper.  IF YOU HAD POLYPS REMOVED DURING THE COLONOSCOPY: No aspirin products for 7 days or as instructed.  No alcohol for 7 days or as instructed.  Eat a soft diet for the next 24 hours.  FINDING OUT THE RESULTS OF YOUR TEST Not all test results are available during your visit. If your test results are not back during the visit, make an appointment with your caregiver to find out the  results. Do not assume everything is normal if you have not heard from your caregiver or the medical facility. It is important for you to follow up on all of your test results.  SEEK IMMEDIATE MEDICAL ATTENTION IF: You have more than a spotting of blood in your stool.  Your belly is swollen (abdominal distention).  You are nauseated or vomiting.  You have a temperature over 101.  You have abdominal pain or discomfort that is severe or gets worse throughout the day.     2 polyps removed from your colon  Diverticulosis, colon polyp and constipation information provided   use MiraLAX 1 capful of powder at bedtime as needed for constipation  Further recommendations to follow pending review of pathology report   at patient request, I called Andrea Benitez at [phone] -  reviewed findings and recommendations

## 2023-01-06 NOTE — Transfer of Care (Signed)
Immediate Anesthesia Transfer of Care Note  Patient: Annie Main  Procedure(s) Performed: COLONOSCOPY WITH PROPOFOL POLYPECTOMY  Patient Location: Endoscopy Unit  Anesthesia Type:General  Level of Consciousness: awake, alert , oriented, and patient cooperative  Airway & Oxygen Therapy: Patient Spontanous Breathing  Post-op Assessment: Report given to RN, Post -op Vital signs reviewed and stable, and Patient moving all extremities  Post vital signs: Reviewed and stable  Last Vitals:  Vitals Value Taken Time  BP    Temp    Pulse    Resp    SpO2      Last Pain:  Vitals:   01/06/23 0658  TempSrc: Oral  PainSc: 0-No pain      Patients Stated Pain Goal: 9 (67/01/10 0349)  Complications: No notable events documented.

## 2023-01-06 NOTE — Anesthesia Preprocedure Evaluation (Addendum)
Anesthesia Evaluation  Patient identified by MRN, date of birth, ID band Patient awake    Reviewed: Allergy & Precautions, H&P , NPO status , Patient's Chart, lab work & pertinent test results  Airway Mallampati: I  TM Distance: >3 FB Neck ROM: Full    Dental  (+) Dental Advisory Given, Loose, Poor Dentition,    Pulmonary shortness of breath and with exertion, Current Smoker   Pulmonary exam normal breath sounds clear to auscultation       Cardiovascular METS: 3 - Mets + DOE  Normal cardiovascular exam Rhythm:Regular Rate:Normal     Neuro/Psych  PSYCHIATRIC DISORDERS  Depression    negative neurological ROS     GI/Hepatic negative GI ROS, Neg liver ROS,,,  Endo/Other  diabetes, Well Controlled, Type 2, Oral Hypoglycemic AgentsHypothyroidism    Renal/GU negative Renal ROS  negative genitourinary   Musculoskeletal negative musculoskeletal ROS (+)    Abdominal   Peds negative pediatric ROS (+)  Hematology negative hematology ROS (+)   Anesthesia Other Findings Room air saturations 90 to 94%. Lungs clear, chronic smoker, >60 pack years, probably base line low sats, will give duoneb treatments before the procedure  Reproductive/Obstetrics negative OB ROS                             Anesthesia Physical Anesthesia Plan  ASA: 3  Anesthesia Plan: General   Post-op Pain Management: Minimal or no pain anticipated   Induction: Intravenous  PONV Risk Score and Plan: 1 and Propofol infusion  Airway Management Planned: Nasal Cannula and Natural Airway  Additional Equipment:   Intra-op Plan:   Post-operative Plan:   Informed Consent: I have reviewed the patients History and Physical, chart, labs and discussed the procedure including the risks, benefits and alternatives for the proposed anesthesia with the patient or authorized representative who has indicated his/her understanding and  acceptance.     Dental advisory given  Plan Discussed with: CRNA and Surgeon  Anesthesia Plan Comments:         Anesthesia Quick Evaluation

## 2023-01-06 NOTE — Anesthesia Postprocedure Evaluation (Signed)
Anesthesia Post Note  Patient: Andrea Benitez  Procedure(s) Performed: COLONOSCOPY WITH PROPOFOL POLYPECTOMY  Patient location during evaluation: Phase II Anesthesia Type: General Level of consciousness: awake and alert and oriented Pain management: pain level controlled Vital Signs Assessment: post-procedure vital signs reviewed and stable Respiratory status: spontaneous breathing, nonlabored ventilation and respiratory function stable Cardiovascular status: blood pressure returned to baseline and stable Postop Assessment: no apparent nausea or vomiting Anesthetic complications: no  No notable events documented.   Last Vitals:  Vitals:   01/06/23 0658 01/06/23 0859  BP: 119/76 103/71  Pulse: 87 (!) 109  Resp: 19 15  Temp: 36.8 C 36.4 C  SpO2: 92% 95%    Last Pain:  Vitals:   01/06/23 0859  TempSrc: Axillary  PainSc: 0-No pain                 Xena Propst C Nyal Schachter

## 2023-01-06 NOTE — Op Note (Signed)
[facility] Patient Name: Andrea Benitez Procedure Date: 01/06/2023 8:09 AM MRN: [id number] Date of Birth: Jun 07, 1961 Attending MD: Norvel Richards , MD, [id number] CSN: [id number] Age: 62 Admit Type: Outpatient Procedure:                Colonoscopy Indications:              Heme positive stool Providers:                Norvel Richards, MD, Janeece Riggers, RN, Dereck Leep, Technician Referring MD:              Medicines:                Propofol per Anesthesia Complications:            No immediate complications. Estimated Blood Loss:     Estimated blood loss was minimal. Procedure:                Pre-Anesthesia Assessment:                           - Prior to the procedure, a History and Physical                            was performed, and patient medications and                            allergies were reviewed. The patient's tolerance of                            previous anesthesia was also reviewed. The risks                            and benefits of the procedure and the sedation                            options and risks were discussed with the patient.                            All questions were answered, and informed consent                            was obtained. Prior Anticoagulants: The patient has                            taken no anticoagulant or antiplatelet agents. ASA                            Grade Assessment: III - A patient with severe                            systemic disease. After reviewing the risks and  benefits, the patient was deemed in satisfactory                            condition to undergo the procedure.                           After obtaining informed consent, the colonoscope                            was passed under direct vision. Throughout the                            procedure, the patient's blood pressure, pulse, and                            oxygen  saturations were monitored continuously. The                            [id number]) scope was introduced through                            the anus and advanced to the the cecum, identified                            by appendiceal orifice and ileocecal valve. The                            colonoscopy was performed without difficulty. The                            patient tolerated the procedure well. The quality                            of the bowel preparation was adequate. The                            ileocecal valve, appendiceal orifice, and rectum                            were photographed. The entire colon was well                            visualized. Scope In: 8:30:46 AM Scope Out: 8:52:00 AM Scope Withdrawal Time: 0 hours 17 minutes 26 seconds  Total Procedure Duration: 0 hours 21 minutes 14 seconds  Findings:      The perianal and digital rectal examinations were normal.      Scattered small-mouthed diverticula were found in the sigmoid colon.      A 5 mm polyp was found in the cecum. The polyp was sessile. The polyp       was removed with a cold snare. Resection and retrieval were complete.       Estimated blood loss was minimal.      A 10 mm polyp was found in the transverse colon. The polyp was       semi-pedunculated.  The polyp was removed with a hot snare. Resection and       retrieval were complete. Estimated blood loss: none.      The exam was otherwise without abnormality on direct and retroflexion       views. Impression:               - Diverticulosis in the sigmoid colon.                           - One 5 mm polyp in the cecum, removed with a cold                            snare. Resected and retrieved.                           - One 10 mm polyp in the transverse colon, removed                            with a hot snare. Resected and retrieved.                           - The examination was otherwise normal on direct                             and retroflexion views. Moderate Sedation:      Moderate (conscious) sedation was personally administered by an       anesthesia professional. The following parameters were monitored: oxygen       saturation, heart rate, blood pressure, respiratory rate, EKG, adequacy       of pulmonary ventilation, and response to care. Recommendation:           - Patient has a contact number available for                            emergencies. The signs and symptoms of potential                            delayed complications were discussed with the                            patient. Return to normal activities tomorrow.                            Written discharge instructions were provided to the                            patient.                           - Resume previous diet.                           - Continue present medications.                           - Repeat colonoscopy date to be  determined after                            pending pathology results are reviewed for                            surveillance.                           - Return to GI office (date not yet determined). Procedure Code(s):        --- Professional ---                           [phone], Colonoscopy, flexible; with removal of                            tumor(s), polyp(s), or other lesion(s) by snare                            technique Diagnosis Code(s):        --- Professional ---                           D12.0, Benign neoplasm of cecum                           D12.3, Benign neoplasm of transverse colon (hepatic                            flexure or splenic flexure)                           R19.5, Other fecal abnormalities                           K57.30, Diverticulosis of large intestine without                            perforation or abscess without bleeding CPT copyright 2022 [employer]. All rights reserved. The codes documented in this report are preliminary and upon coder review may   be revised to meet current compliance requirements. Cristopher Estimable. Reuven Braver, MD Norvel Richards, MD 01/06/2023 9:02:55 AM This report has been signed electronically. Number of Addenda: 0

## 2023-01-07 ENCOUNTER — Encounter: Payer: Self-pay | Admitting: Internal Medicine

## 2023-01-07 LAB — SURGICAL PATHOLOGY

## 2023-01-12 ENCOUNTER — Encounter (HOSPITAL_COMMUNITY): Payer: Self-pay | Admitting: Internal Medicine

## 2023-01-13 ENCOUNTER — Encounter: Payer: Self-pay | Admitting: Gastroenterology

## 2023-01-18 ENCOUNTER — Encounter: Payer: Self-pay | Admitting: Obstetrics & Gynecology

## 2023-12-22 ENCOUNTER — Other Ambulatory Visit: Payer: Self-pay | Admitting: Obstetrics & Gynecology

## 2024-09-24 NOTE — Progress Notes (Unsigned)
 Chief Complaint: Left hydronephrosis  History of Present Illness: 63 year old female coming in today for follow-up of abnormality seen on CT scan.  She presented to the emergency room at a [facility] around [place] in late July with left flank pain.  She was diagnosed with a probable left ureteral stone.  Symptoms only lasted about a week.  She had recent follow-up stone protocol CT about 2 weeks ago revealing resolution of the hydronephrosis with no evidence of ureteral stone.  No prior history of urologic illnesses.  Currently no gross hematuria or dysuria.  No lower urinary tract symptoms.   Past Medical History:  Past Medical History:  Diagnosis Date   Depression    Diabetes mellitus without complication (HCC)    Hypothyroidism    Thyroid  disease     Past Surgical History:  Past Surgical History:  Procedure Laterality Date   CHOLECYSTECTOMY     COLONOSCOPY WITH PROPOFOL  N/A 01/06/2023   Procedure: COLONOSCOPY WITH PROPOFOL ;  Surgeon: Shaaron Lamar HERO, MD;  Location: [facility];  Service: Endoscopy;  Laterality: N/A;  8:15 am   POLYPECTOMY  01/06/2023   Procedure: POLYPECTOMY;  Surgeon: Shaaron Lamar HERO, MD;  Location: [facility];  Service: Endoscopy;;    Allergies:  No Known Allergies  Family History:  Family History  Problem Relation Age of Onset   Colon cancer Neg Hx    Colon polyps Neg Hx     Social History:  Social History   Tobacco Use   Smoking status: Every Day    Current packs/day: 1.00    Types: Cigarettes   Smokeless tobacco: Never  Vaping Use   Vaping status: Never Used  Substance Use Topics   Alcohol use: No   Drug use: No    Review of symptoms:  Constitutional:  Negative for unexplained weight loss, night sweats, fever, chills ENT:  Negative for nose bleeds, sinus pain, painful swallowing CV:  Negative for chest pain, shortness of breath, exercise intolerance, palpitations, loss of consciousness Resp:  Negative for cough,  wheezing, shortness of breath GI:  Negative for nausea, vomiting, diarrhea, bloody stools GU:  Positives noted in HPI; otherwise negative for gross hematuria, dysuria, urinary incontinence Neuro:  Negative for seizures, poor balance, limb weakness, slurred speech Psych:  Negative for lack of energy, depression, anxiety Endocrine:  Negative for polydipsia, polyuria, symptoms of hypoglycemia (dizziness, hunger, sweating) Hematologic:  Negative for anemia, purpura, petechia, prolonged or excessive bleeding, use of anticoagulants  Allergic:  Negative for difficulty breathing or choking as a result of exposure to anything; no shellfish allergy; no allergic response (rash/itch) to materials, foods  Physical exam: There were no vitals taken for this visit. GENERAL APPEARANCE:  Well appearing, well developed, well nourished, NAD HEENT: Atraumatic, Normocephalic, oropharynx clear. NECK: Supple without lymphadenopathy or thyromegaly. LUNGS: Clear to auscultation bilaterally. HEART: Regular Rate and Rhythm without murmurs, gallops, or rubs. EXTREMITIES: Moves all extremities well.  Without clubbing, cyanosis, or edema. NEUROLOGIC:  Alert and oriented x 3, normal gait, CN II-XII grossly intact.  MENTAL STATUS:  Appropriate. BACK:  Non-tender to palpation.  No CVAT SKIN:  Warm, dry and intact.    Results:  I have reviewed referring/prior physicians records  I have reviewed urinalysis  I have reviewed prior urine cultures  I reviewed prior imaging studies--CT abdomen/pelvis from July 2025: IMPRESSION:  Mild LEFT hydroureteronephrosis; cannot definitively confirm a current ureteral stone, although there are some calcifications grossly along the course of the LEFT ureter, which may  be vascular in nature. No stone in the bladder. Could potentially be sequela of a recently passed stone.  Independently reviewed CT images.  Assessment: Probable passed left ureteral stone, no remaining renal calculi,  appearance of left ureter now normal   Plan: Reassurance regarding her CT scan.  She was given back her disc  She will return as needed

## 2024-09-25 ENCOUNTER — Ambulatory Visit: Admitting: Urology

## 2024-09-25 VITALS — BP 123/77 | HR 84

## 2024-09-25 DIAGNOSIS — Z87448 Personal history of other diseases of urinary system: Secondary | ICD-10-CM

## 2024-09-25 DIAGNOSIS — Z87442 Personal history of urinary calculi: Secondary | ICD-10-CM

## 2024-09-25 DIAGNOSIS — Z09 Encounter for follow-up examination after completed treatment for conditions other than malignant neoplasm: Secondary | ICD-10-CM | POA: Diagnosis not present

## 2024-09-25 DIAGNOSIS — N1330 Unspecified hydronephrosis: Secondary | ICD-10-CM

## 2024-09-26 LAB — URINALYSIS, ROUTINE W REFLEX MICROSCOPIC
Bilirubin, UA: NEGATIVE
Glucose, UA: NEGATIVE
Ketones, UA: NEGATIVE
Leukocytes,UA: NEGATIVE
Nitrite, UA: NEGATIVE
Protein,UA: NEGATIVE
RBC, UA: NEGATIVE
Specific Gravity, UA: 1.02 (ref 1.005–1.030)
Urobilinogen, Ur: 0.2 mg/dL (ref 0.2–1.0)
pH, UA: 6 (ref 5.0–7.5)

## 2024-12-19 ENCOUNTER — Other Ambulatory Visit: Payer: Self-pay | Admitting: Obstetrics & Gynecology
